# Patient Record
Sex: Male | Born: 1995 | Race: Black or African American | Hispanic: No | Marital: Single | Smoking: Never smoker
Health system: Southern US, Community
[De-identification: ages and names within clinical notes are randomized; demographics above are authoritative.]

## PROBLEM LIST (undated history)

## (undated) DIAGNOSIS — K603 Anal fistula, unspecified: Secondary | ICD-10-CM

## (undated) HISTORY — PX: WISDOM TOOTH EXTRACTION: SHX21

---
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
## 2014-01-03 ENCOUNTER — Ambulatory Visit: Payer: Medicaid Other | Admitting: Gastroenterology

## 2014-03-20 ENCOUNTER — Emergency Department (HOSPITAL_COMMUNITY)
Admission: EM | Admit: 2014-03-20 | Discharge: 2014-03-20 | Disposition: A | Discharge status: Home / Self Care | Payer: Medicaid Other | Attending: Emergency Medicine | Admitting: Emergency Medicine

## 2014-03-20 ENCOUNTER — Encounter (HOSPITAL_COMMUNITY): Payer: Self-pay | Admitting: *Deleted

## 2014-03-20 DIAGNOSIS — K625 Hemorrhage of anus and rectum: Secondary | ICD-10-CM | POA: Diagnosis present

## 2014-03-20 DIAGNOSIS — Z720 Tobacco use: Secondary | ICD-10-CM | POA: Diagnosis not present

## 2014-03-20 LAB — BASIC METABOLIC PANEL
Anion gap: 5 (ref 5–15)
BUN: 9 mg/dL (ref 6–23)
CHLORIDE: 105 meq/L (ref 96–112)
CO2: 27 mmol/L (ref 19–32)
Calcium: 9 mg/dL (ref 8.4–10.5)
Creatinine, Ser: 1.01 mg/dL (ref 0.50–1.35)
GFR calc Af Amer: >90 mL/min (ref >90)
GLUCOSE: 91 mg/dL (ref 70–99)
POTASSIUM: 3.8 mmol/L (ref 3.5–5.1)
Sodium: 137 mmol/L (ref 135–145)

## 2014-03-20 LAB — CBC
HEMATOCRIT: 43.6 % (ref 39.0–52.0)
Hemoglobin: 14.3 g/dL (ref 13.0–17.0)
MCH: 28 pg (ref 26.0–34.0)
MCHC: 32.8 g/dL (ref 30.0–36.0)
MCV: 85.3 fL (ref 78.0–100.0)
PLATELETS: 212 10*3/uL (ref 150–400)
RBC: 5.11 MIL/uL (ref 4.22–5.81)
RDW: 13.7 % (ref 11.5–15.5)
WBC: 4.7 10*3/uL (ref 4.0–10.5)

## 2014-03-20 MED ORDER — DOCUSATE SODIUM 100 MG PO CAPS: 100.0000 mg | ORAL_CAPSULE | Freq: Two times a day (BID) | ORAL | Status: DC

## 2014-03-20 NOTE — Discharge Instructions (Signed)
Please call your doctor for a followup appointment within 24-48 hours. When you talk to your doctor please let them know that you were seen in the emergency department and have them acquire all of your records so that they can discuss the findings with you and formulate a treatment plan to fully care for your new and ongoing problems. ° °[facility] Doctor List ° ° ° °Edward Hawkins MD. Specialty: Pulmonary Disease Contact information: [address]  °[address]  °[place] [address]  °[phone]  ° °Margaret Simpson, MD. Specialty: Family Medicine Contact information: [address]  °[place] [address]  °[phone]  ° °Scott Luking, MD. Specialty: Family Medicine Contact information: [address]  °[address]  °[place] [address]  °[phone]  ° °Tesfaye Fanta, MD Specialty: Internal Medicine Contact information: [address]  °[place] [address]  °[phone]  ° °Zach Hall, MD. Specialty: Internal Medicine Contact information: [address]  °[place] [address]  °[phone]  ° °Angus Mcinnis, MD. Specialty: Family Medicine Contact information: [address]  °[place] [address]  °[phone]  ° °Stephen Knowlton, MD. Specialty: Family Medicine Contact information: [address]  °[address]  °[place] [address]  °[phone]  ° °Roy Fagan, MD. Specialty: Internal Medicine Contact information: [address]  °[address]  °[place]  [address]  °[phone]  ° ° °

## 2014-03-20 NOTE — ED Notes (Signed)
Pt c/o rectal bleeding that started today; pt states he was suppose to go see GI doctor but has been yet

## 2014-03-20 NOTE — ED Provider Notes (Signed)
CSN: [id number]     Arrival date & time 03/20/14  2122 History  This chart was scribed for Vida Roller, MD by Annye Asa, ED Scribe. This patient was seen in room APA09/APA09 and the patient's care was started at 10:32 PM.    Chief Complaint  Patient presents with  . Rectal Bleeding   The history is provided by the patient. No language interpreter was used.     HPI Comments: Ryan Small is an otherwise healthy 19 y.o. male who presents to the Emergency Department complaining of 7 months of intermittent rectal bleeding. He reports that bleeding only occurs when passing a BM; he describes this as "spraying burgundy blood." He has pain with all bowel movements, regardless of consistency. He describes his stools as "brown with blood." He denies light headedness, SOB, abdominal pain.  He states that his BM's are normal - sometimes with blood spots in them.  The bleeding occurs every couple of months - it is not daily.  He was scheduled to see a GI but continued to reschedule the appointment until he was told by the office staff that he would have to seek another referral.  He is not on blood thinners.   Past Medical History  Diagnosis Date  . Allergy     peanuts, all nuts   Past Surgical History  Procedure Laterality Date  . Wisdom tooth extraction     Family History  Problem Relation Age of Onset  . Diabetes Mother    History  Substance Use Topics  . Smoking status: Current Some Day Smoker  . Smokeless tobacco: Never Used  . Alcohol Use: Yes     Comment: ocassionally    Review of Systems  Gastrointestinal: Positive for blood in stool and anal bleeding.  All other systems reviewed and are negative.   Allergies  Peanut-containing drug products  Home Medications   Prior to Admission medications   Medication Sig Start Date End Date Taking? Authorizing Provider  docusate sodium (COLACE) 100 MG capsule Take 1 capsule (100 mg total) by mouth every 12 (twelve) hours.  03/20/14   Vida Roller, MD  EPINEPHrine (EPIPEN) 0.3 mg/0.3 mL DEVI Inject 0.3 mLs (0.3 mg total) into the muscle as needed. 07/28/12   Hope Orlene Och, NP  oxyCODONE-acetaminophen (PERCOCET) 5-325 MG per tablet Take 1 tablet by mouth every 4 (four) hours as needed. Patient not taking: Reported on 03/20/2014 04/26/13   Donnetta Hutching, MD  promethazine (PHENERGAN) 25 MG tablet Take 1 tablet (25 mg total) by mouth every 6 (six) hours as needed. Patient not taking: Reported on 03/20/2014 04/26/13   Donnetta Hutching, MD   BP 143/99 mmHg  Pulse 77  Temp(Src) 98.3 F (36.8 C) (Oral)  Resp 24  Ht  (1.803 m)  Wt 171 lb (77.565 kg)  BMI 23.86 kg/m2  SpO2 100% Physical Exam  Constitutional: He appears well-developed and well-nourished.  HENT:  Head: Normocephalic and atraumatic.  Eyes: Conjunctivae are normal. Right eye exhibits no discharge. Left eye exhibits no discharge.  Pulmonary/Chest: Effort normal. No respiratory distress.  Abdominal: Soft. There is no tenderness.  Genitourinary:  Patient refused rectal exam after thorough explanation of the medical necessity of the procedure. External exam tolerated - no signs of fissure, or external hemorrhoid or perianal masses / tendereness or redness  Neurological: He is alert. Coordination normal.  Skin: Skin is warm and dry. No rash noted. He is not diaphoretic. No erythema.  Psychiatric: He has a  normal mood and affect.  Nursing note and vitals reviewed.   ED Course  Procedures   DIAGNOSTIC STUDIES: Oxygen Saturation is 100% on RA, normal by my interpretation.    COORDINATION OF CARE: 10:40 PM Discussed treatment plan with pt at bedside and pt agreed to plan.   Labs Review Labs Reviewed  CBC  BASIC METABOLIC PANEL    Imaging Review No results found.    MDM   Final diagnoses:  Rectal bleeding    Overall the patient is well-appearing with normal vital signs, he is not tachypneic on my exam. He has a normal hemoglobin, he refuses  rectal exam after multiple discussions regarding the necessity of this procedure. I will give him another referral to the gastroenterologist, he appears hemodynamically stable. I have counseled him regarding not placing anything in his rectum, against using anticoagulants and I have cautioned him that he should use stool softeners.  Meds given in ED:  Medications - No data to display  New Prescriptions   DOCUSATE SODIUM (COLACE) 100 MG CAPSULE    Take 1 capsule (100 mg total) by mouth every 12 (twelve) hours.     I personally performed the services described in this documentation, which was scribed in my presence. The recorded information has been reviewed and is accurate.        Vida Roller, MD 03/20/14 [phone]

## 2014-04-12 ENCOUNTER — Telehealth: Payer: Self-pay | Admitting: Internal Medicine

## 2014-04-12 ENCOUNTER — Encounter: Payer: Self-pay | Admitting: General Practice

## 2014-04-12 ENCOUNTER — Ambulatory Visit: Payer: Medicaid Other | Admitting: Nurse Practitioner

## 2014-04-12 NOTE — Telephone Encounter (Signed)
We need to retract the discharge letter since the patient has an upcoming appt that

## 2014-04-12 NOTE — Telephone Encounter (Signed)
Letter mailed

## 2014-04-12 NOTE — Telephone Encounter (Signed)
We need to send out a discharge letter.

## 2014-04-12 NOTE — Telephone Encounter (Signed)
Send it

## 2014-04-12 NOTE — Telephone Encounter (Signed)
We received a [facility]. He has either no showed or cancelled his OV from 11/01/13, 12/02/13, 01/03/14 and 04/12/14 and is currently scheduled for 05/01/2014.  [facility] [place] [facility] and told her that we have been trying to see this patient for rectal bleeding since August 2015 and has not kept any of his appointments and we can not continue making office visits for him if he isn't going to come to them and it's been 6 months. She agreed.

## 2014-04-12 NOTE — Telephone Encounter (Signed)
If he no shows for that appointment, we will send a discharge letter at that time.  Discharge letter was voided out of Epic.

## 2014-04-12 NOTE — Telephone Encounter (Signed)
So, do we have a doctor-patient relationship based on multiple scheduled and canceled appointments and one upcoming or not. Do we need to cancel and send a discharge letter?

## 2014-05-01 ENCOUNTER — Ambulatory Visit: Payer: Medicaid Other | Admitting: Nurse Practitioner

## 2014-05-02 ENCOUNTER — Encounter: Payer: Self-pay | Admitting: Nurse Practitioner

## 2014-05-17 ENCOUNTER — Ambulatory Visit: Payer: Medicaid Other | Admitting: Nurse Practitioner

## 2014-05-22 ENCOUNTER — Telehealth: Payer: Self-pay | Admitting: Nurse Practitioner

## 2014-05-22 ENCOUNTER — Ambulatory Visit: Payer: Medicaid Other | Admitting: Nurse Practitioner

## 2014-05-22 NOTE — Telephone Encounter (Signed)
PATIENT WAS A NO SHOW

## 2014-05-22 NOTE — Telephone Encounter (Signed)
Noted  

## 2014-05-24 ENCOUNTER — Emergency Department (HOSPITAL_COMMUNITY): Payer: Medicaid Other

## 2014-05-24 ENCOUNTER — Emergency Department (HOSPITAL_COMMUNITY)
Admission: EM | Admit: 2014-05-24 | Discharge: 2014-05-24 | Disposition: A | Discharge status: Home / Self Care | Payer: Medicaid Other | Attending: Emergency Medicine | Admitting: Emergency Medicine

## 2014-05-24 ENCOUNTER — Encounter (HOSPITAL_COMMUNITY): Payer: Self-pay

## 2014-05-24 DIAGNOSIS — Z79899 Other long term (current) drug therapy: Secondary | ICD-10-CM | POA: Diagnosis not present

## 2014-05-24 DIAGNOSIS — J069 Acute upper respiratory infection, unspecified: Secondary | ICD-10-CM

## 2014-05-24 DIAGNOSIS — M791 Myalgia: Secondary | ICD-10-CM | POA: Diagnosis not present

## 2014-05-24 DIAGNOSIS — R069 Unspecified abnormalities of breathing: Secondary | ICD-10-CM | POA: Insufficient documentation

## 2014-05-24 DIAGNOSIS — R05 Cough: Secondary | ICD-10-CM | POA: Diagnosis present

## 2014-05-24 MED ORDER — ACETAMINOPHEN 325 MG PO TABS
650.0000 mg | ORAL_TABLET | Freq: Once | ORAL | Status: AC
  Administered 2014-05-24: 650 mg via ORAL
  Filled 2014-05-24: qty 2

## 2014-05-24 MED ORDER — KETOROLAC TROMETHAMINE 10 MG PO TABS
10.0000 mg | ORAL_TABLET | Freq: Once | ORAL | Status: AC
  Administered 2014-05-24: 10 mg via ORAL
  Filled 2014-05-24: qty 1

## 2014-05-24 NOTE — ED Provider Notes (Signed)
CSN: [id number]     Arrival date & time 05/24/14  [id number] History   First MD Initiated Contact with Patient 05/24/14 [phone]     Chief Complaint  Patient presents with  . Cough  . Generalized Body Aches     (Consider location/radiation/quality/duration/timing/severity/associated sxs/prior Treatment) Patient is a 19 y.o. male presenting with cough. The history is provided by the patient.  Cough Cough characteristics:  Non-productive Severity:  Moderate Onset quality:  Gradual Duration:  1 day Timing:  Intermittent Progression:  Worsening Chronicity:  New Smoker: yes   Context: sick contacts and weather changes   Relieved by:  Nothing Worsened by:  Nothing tried Associated symptoms: chills, myalgias, sinus congestion and sore throat   Associated symptoms: no chest pain, no eye discharge, no rash, no shortness of breath and no wheezing   Associated symptoms comment:  Watery eyes Risk factors: no recent travel     Past Medical History  Diagnosis Date  . Allergy     peanuts, all nuts   Past Surgical History  Procedure Laterality Date  . Wisdom tooth extraction     Family History  Problem Relation Age of Onset  . Diabetes Mother    History  Substance Use Topics  . Smoking status: Current Some Day Smoker  . Smokeless tobacco: Never Used  . Alcohol Use: Yes     Comment: ocassionally    Review of Systems  Constitutional: Positive for chills and fatigue. Negative for activity change.       All ROS Neg except as noted in HPI  HENT: Positive for congestion and sore throat. Negative for nosebleeds.   Eyes: Negative for photophobia and discharge.  Respiratory: Positive for cough. Negative for shortness of breath and wheezing.   Cardiovascular: Negative for chest pain and palpitations.  Gastrointestinal: Negative for abdominal pain and blood in stool.  Genitourinary: Negative for dysuria, frequency and hematuria.  Musculoskeletal: Positive for myalgias. Negative for back pain,  arthralgias and neck pain.  Skin: Negative.  Negative for rash.  Neurological: Negative for dizziness, seizures and speech difficulty.  Psychiatric/Behavioral: Negative for hallucinations and confusion.      Allergies  Peanut-containing drug products  Home Medications   Prior to Admission medications   Medication Sig Start Date End Date Taking? Authorizing Provider  docusate sodium (COLACE) 100 MG capsule Take 1 capsule (100 mg total) by mouth every 12 (twelve) hours. 03/20/14   Eber HongBrian Miller, MD  EPINEPHrine (EPIPEN) 0.3 mg/0.3 mL DEVI Inject 0.3 mLs (0.3 mg total) into the muscle as needed. 07/28/12   Hope Orlene OchM Neese, NP  oxyCODONE-acetaminophen (PERCOCET) 5-325 MG per tablet Take 1 tablet by mouth every 4 (four) hours as needed. Patient not taking: Reported on 03/20/2014 04/26/13   Donnetta HutchingBrian Cook, MD  promethazine (PHENERGAN) 25 MG tablet Take 1 tablet (25 mg total) by mouth every 6 (six) hours as needed. Patient not taking: Reported on 03/20/2014 04/26/13   Donnetta HutchingBrian Cook, MD   BP 134/94 mmHg  Pulse 71  Temp(Src) 98.2 F (36.8 C) (Oral)  Resp 16  SpO2 100% Physical Exam  Constitutional: He is oriented to person, place, and time. He appears well-developed and well-nourished.  Non-toxic appearance.  HENT:  Head: Normocephalic.  Right Ear: Tympanic membrane and external ear normal.  Left Ear: Tympanic membrane and external ear normal.  Mild to mod nasal congestion.  Eyes: EOM and lids are normal. Pupils are equal, round, and reactive to light.  Neck: Normal range of motion. Neck supple. Carotid bruit  is not present.  Cardiovascular: Normal rate, regular rhythm, normal heart sounds, intact distal pulses and normal pulses.   Pulmonary/Chest: Breath sounds normal. No respiratory distress.  Course breath sounds. Symmetrical rise and fall of the chest.  Abdominal: Soft. Bowel sounds are normal. There is no tenderness. There is no guarding.  Musculoskeletal: Normal range of motion.  Muscle soreness  of upper and lower extremities. No hot joints.  Lymphadenopathy:       Head (right side): No submandibular adenopathy present.       Head (left side): No submandibular adenopathy present.    He has no cervical adenopathy.  Neurological: He is alert and oriented to person, place, and time. He has normal strength. No cranial nerve deficit or sensory deficit.  Skin: Skin is warm and dry.  Psychiatric: He has a normal mood and affect. His speech is normal.  Nursing note and vitals reviewed.   ED Course  Procedures (including critical care time) Labs Review Labs Reviewed - No data to display  Imaging Review No results found.   EKG Interpretation None      MDM  Vital signs are non-acute. Chest xray is negative for acute problem. Suspect viral illness. Pt will increase fluids, wash hands frequently, and use a mask until symptoms resolve. Ibuprofen q6h.   Final diagnoses:  None    **I have reviewed nursing notes, vital signs, and all appropriate lab and imaging results for this patient.Ivery Quale, PA-C 05/24/14 1033  Bethann Berkshire, MD 05/24/14 [phone]

## 2014-05-24 NOTE — Discharge Instructions (Signed)
Please increase water, juice, and gatorades. Use a mask until symptoms have resolved. Use ibuprofen every 6 hours for fever, and aching. Wash hands frequently.  Upper Respiratory Infection, Adult An upper respiratory infection (URI) is also sometimes known as the common cold. The upper respiratory tract includes the nose, sinuses, throat, trachea, and bronchi. Bronchi are the airways leading to the lungs. Most people improve within 1 week, but symptoms can last up to 2 weeks. A residual cough may last even longer.  CAUSES Many different viruses can infect the tissues lining the upper respiratory tract. The tissues become irritated and inflamed and often become very moist. Mucus production is also common. A cold is contagious. You can easily spread the virus to others by oral contact. This includes kissing, sharing a glass, coughing, or sneezing. Touching your mouth or nose and then touching a surface, which is then touched by another person, can also spread the virus. SYMPTOMS  Symptoms typically develop 1 to 3 days after you come in contact with a cold virus. Symptoms vary from person to person. They may include:  Runny nose.  Sneezing.  Nasal congestion.  Sinus irritation.  Sore throat.  Loss of voice (laryngitis).  Cough.  Fatigue.  Muscle aches.  Loss of appetite.  Headache.  Low-grade fever. DIAGNOSIS  You might diagnose your own cold based on familiar symptoms, since most people get a cold 2 to 3 times a year. Your caregiver can confirm this based on your exam. Most importantly, your caregiver can check that your symptoms are not due to another disease such as strep throat, sinusitis, pneumonia, asthma, or epiglottitis. Blood tests, throat tests, and X-rays are not necessary to diagnose a common cold, but they may sometimes be helpful in excluding other more serious diseases. Your caregiver will decide if any further tests are required. RISKS AND COMPLICATIONS  You may be at  risk for a more severe case of the common cold if you smoke cigarettes, have chronic heart disease (such as heart failure) or lung disease (such as asthma), or if you have a weakened immune system. The very young and very old are also at risk for more serious infections. Bacterial sinusitis, middle ear infections, and bacterial pneumonia can complicate the common cold. The common cold can worsen asthma and chronic obstructive pulmonary disease (COPD). Sometimes, these complications can require emergency medical care and may be life-threatening. PREVENTION  The best way to protect against getting a cold is to practice good hygiene. Avoid oral or hand contact with people with cold symptoms. Wash your hands often if contact occurs. There is no clear evidence that vitamin C, vitamin E, echinacea, or exercise reduces the chance of developing a cold. However, it is always recommended to get plenty of rest and practice good nutrition. TREATMENT  Treatment is directed at relieving symptoms. There is no cure. Antibiotics are not effective, because the infection is caused by a virus, not by bacteria. Treatment may include:  Increased fluid intake. Sports drinks offer valuable electrolytes, sugars, and fluids.  Breathing heated mist or steam (vaporizer or shower).  Eating chicken soup or other clear broths, and maintaining good nutrition.  Getting plenty of rest.  Using gargles or lozenges for comfort.  Controlling fevers with ibuprofen or acetaminophen as directed by your caregiver.  Increasing usage of your inhaler if you have asthma. Zinc gel and zinc lozenges, taken in the first 24 hours of the common cold, can shorten the duration and lessen the severity of symptoms.  Pain medicines may help with fever, muscle aches, and throat pain. A variety of non-prescription medicines are available to treat congestion and runny nose. Your caregiver can make recommendations and may suggest nasal or lung inhalers for  other symptoms.  HOME CARE INSTRUCTIONS   Only take over-the-counter or prescription medicines for pain, discomfort, or fever as directed by your caregiver.  Use a warm mist humidifier or inhale steam from a shower to increase air moisture. This may keep secretions moist and make it easier to breathe.  Drink enough water and fluids to keep your urine clear or pale yellow.  Rest as needed.  Return to work when your temperature has returned to normal or as your caregiver advises. You may need to stay home longer to avoid infecting others. You can also use a face mask and careful hand washing to prevent spread of the virus. SEEK MEDICAL CARE IF:   After the first few days, you feel you are getting worse rather than better.  You need your caregiver's advice about medicines to control symptoms.  You develop chills, worsening shortness of breath, or brown or red sputum. These may be signs of pneumonia.  You develop yellow or brown nasal discharge or pain in the face, especially when you bend forward. These may be signs of sinusitis.  You develop a fever, swollen neck glands, pain with swallowing, or white areas in the back of your throat. These may be signs of strep throat. SEEK IMMEDIATE MEDICAL CARE IF:   You have a fever.  You develop severe or persistent headache, ear pain, sinus pain, or chest pain.  You develop wheezing, a prolonged cough, cough up blood, or have a change in your usual mucus (if you have chronic lung disease).  You develop sore muscles or a stiff neck. Document Released: 08/27/2000 Document Revised: 05/26/2011 Document Reviewed: 06/08/2013 [facility] Patient Information 2015 [employer] [place]. This information is not intended to replace advice given to you by your health care provider. Make sure you discuss any questions you have with your health care provider.

## 2014-05-24 NOTE — ED Notes (Signed)
Generalized body aches since yesterday with sore throat and cough and watery eyes per pt.

## 2014-06-21 ENCOUNTER — Emergency Department (HOSPITAL_COMMUNITY): Payer: Medicaid Other

## 2014-06-21 ENCOUNTER — Emergency Department (HOSPITAL_COMMUNITY)
Admission: EM | Admit: 2014-06-21 | Discharge: 2014-06-21 | Disposition: A | Discharge status: Home / Self Care | Payer: Medicaid Other | Attending: Emergency Medicine | Admitting: Emergency Medicine

## 2014-06-21 ENCOUNTER — Encounter (HOSPITAL_COMMUNITY): Payer: Self-pay | Admitting: Emergency Medicine

## 2014-06-21 DIAGNOSIS — R410 Disorientation, unspecified: Secondary | ICD-10-CM | POA: Diagnosis not present

## 2014-06-21 DIAGNOSIS — R000 Tachycardia, unspecified: Secondary | ICD-10-CM | POA: Insufficient documentation

## 2014-06-21 DIAGNOSIS — Z720 Tobacco use: Secondary | ICD-10-CM | POA: Diagnosis not present

## 2014-06-21 DIAGNOSIS — R4182 Altered mental status, unspecified: Secondary | ICD-10-CM | POA: Diagnosis present

## 2014-06-21 DIAGNOSIS — F05 Delirium due to known physiological condition: Secondary | ICD-10-CM

## 2014-06-21 LAB — CBC WITH DIFFERENTIAL/PLATELET
Basophils Absolute: 0 10*3/uL (ref 0.0–0.1)
Basophils Relative: 0 % (ref 0–1)
Eosinophils Absolute: 0.2 10*3/uL (ref 0.0–0.7)
Eosinophils Relative: 4 % (ref 0–5)
HCT: 41 % (ref 39.0–52.0)
HEMOGLOBIN: 13.7 g/dL (ref 13.0–17.0)
LYMPHS ABS: 1.7 10*3/uL (ref 0.7–4.0)
Lymphocytes Relative: 37 % (ref 12–46)
MCH: 27.7 pg (ref 26.0–34.0)
MCHC: 33.4 g/dL (ref 30.0–36.0)
MCV: 82.8 fL (ref 78.0–100.0)
MONOS PCT: 12 % (ref 3–12)
Monocytes Absolute: 0.5 10*3/uL (ref 0.1–1.0)
Neutro Abs: 2.1 10*3/uL (ref 1.7–7.7)
Neutrophils Relative %: 47 % (ref 43–77)
Platelets: 206 10*3/uL (ref 150–400)
RBC: 4.95 MIL/uL (ref 4.22–5.81)
RDW: 13.9 % (ref 11.5–15.5)
WBC: 4.5 10*3/uL (ref 4.0–10.5)

## 2014-06-21 LAB — COMPREHENSIVE METABOLIC PANEL
ALK PHOS: 85 U/L (ref 39–117)
ALT: 15 U/L (ref 0–53)
ANION GAP: 8 (ref 5–15)
AST: 22 U/L (ref 0–37)
Albumin: 3.6 g/dL (ref 3.5–5.2)
BILIRUBIN TOTAL: 0.7 mg/dL (ref 0.3–1.2)
BUN: 10 mg/dL (ref 6–23)
CO2: 26 mmol/L (ref 19–32)
Calcium: 8.7 mg/dL (ref 8.4–10.5)
Chloride: 105 mmol/L (ref 96–112)
Creatinine, Ser: 1.12 mg/dL (ref 0.50–1.35)
GFR calc Af Amer: >90 mL/min (ref >90)
GLUCOSE: 95 mg/dL (ref 70–99)
POTASSIUM: 3.8 mmol/L (ref 3.5–5.1)
Sodium: 139 mmol/L (ref 135–145)
Total Protein: 6.4 g/dL (ref 6.0–8.3)

## 2014-06-21 LAB — URINALYSIS, ROUTINE W REFLEX MICROSCOPIC
BILIRUBIN URINE: NEGATIVE
Glucose, UA: NEGATIVE mg/dL
Hgb urine dipstick: NEGATIVE
Ketones, ur: NEGATIVE mg/dL
Leukocytes, UA: NEGATIVE
Nitrite: NEGATIVE
PROTEIN: NEGATIVE mg/dL
SPECIFIC GRAVITY, URINE: 1.014 (ref 1.005–1.030)
Urobilinogen, UA: 1 mg/dL (ref 0.0–1.0)
pH: 6 (ref 5.0–8.0)

## 2014-06-21 LAB — ACETAMINOPHEN LEVEL: Acetaminophen (Tylenol), Serum: <10 ug/mL — ABNORMAL LOW (ref 10–30)

## 2014-06-21 LAB — RAPID URINE DRUG SCREEN, HOSP PERFORMED
AMPHETAMINES: NOT DETECTED
BENZODIAZEPINES: NOT DETECTED
Barbiturates: NOT DETECTED
Cocaine: NOT DETECTED
Opiates: NOT DETECTED
TETRAHYDROCANNABINOL: POSITIVE — AB

## 2014-06-21 LAB — CBG MONITORING, ED: Glucose-Capillary: 85 mg/dL (ref 70–99)

## 2014-06-21 LAB — SALICYLATE LEVEL: Salicylate Lvl: <4 mg/dL (ref 2.8–20.0)

## 2014-06-21 LAB — ETHANOL: Alcohol, Ethyl (B): <5 mg/dL (ref 0–9)

## 2014-06-21 MED ORDER — SODIUM CHLORIDE 0.9 % IV BOLUS (SEPSIS)
1000.0000 mL | Freq: Once | INTRAVENOUS | Status: AC
  Administered 2014-06-21: 1000 mL via INTRAVENOUS

## 2014-06-21 NOTE — ED Notes (Signed)
Pt ambulated to the bathroom with standby assistance from myself. Pt tolerated well.

## 2014-06-21 NOTE — ED Notes (Signed)
MD Rees at bedside. 

## 2014-06-21 NOTE — ED Notes (Signed)
Gave pt water per MD Madilyn Hookees. Pt tolerating well at this time.

## 2014-06-21 NOTE — ED Provider Notes (Signed)
CSN: [id number]     Arrival date & time 06/21/14  1905 History   First MD Initiated Contact with Patient 06/21/14 1906     Chief Complaint  Patient presents with  . Altered Mental Status     Patient is a 19 y.o. male presenting with altered mental status. The history is provided by the patient and the EMS personnel. No language interpreter was used.  Altered Mental Status  Mr. Kovar presents for evaluation of change in his mental status. 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 He states "I know I'm here, but I feel like I'm in a giant movie theater." He denies any previous medical problems and he denies tobacco, alcohol use. He endorses occasional marijuana use but none recently. He denies similar symptoms previously. He denies any active hallucinations, SI, HI. He states he is a family medical history of diabetes. Symptoms are moderate, constant, improving. He denies any over-the-counter drug use.  Past Medical History  Diagnosis Date  . Allergy     peanuts, all nuts   Past Surgical History  Procedure Laterality Date  . Wisdom tooth extraction     Family History  Problem Relation Age of Onset  . Diabetes Mother    History  Substance Use Topics  . Smoking status: Current Some Day Smoker  . Smokeless tobacco: Never Used  . Alcohol Use: Yes     Comment: ocassionally    Review of Systems  All other systems reviewed and are negative.     Allergies  Peanut-containing drug products  Home Medications   Prior to Admission medications   Medication Sig Start Date End Date Taking? Authorizing Provider  docusate sodium (COLACE) 100 MG capsule Take 1 capsule (100 mg total) by mouth every 12 (twelve) hours. Patient not taking: Reported on 05/24/2014 03/20/14   Eber Hong, MD  EPINEPHrine (EPIPEN) 0.3 mg/0.3 mL DEVI Inject 0.3 mLs (0.3 mg total) into the muscle as needed. 07/28/12   Hope Orlene Och, NP  oxyCODONE-acetaminophen (PERCOCET) 5-325 MG per tablet Take 1 tablet by mouth every 4 (four) hours as needed. Patient not taking: Reported on 03/20/2014 04/26/13   Donnetta Hutching, MD  promethazine (PHENERGAN) 25 MG tablet Take 1 tablet (25 mg total) by mouth every 6 (six) hours as needed. Patient not taking: Reported on 03/20/2014 04/26/13   Donnetta Hutching, MD   BP 157/120 mmHg  Pulse 114  Resp 29  Ht  (1.803 m)  Wt 175 lb (79.379 kg)  BMI 24.42 kg/m2  SpO2 100% Physical Exam  Constitutional: He is oriented to person, place, and time. He appears well-developed and well-nourished.  HENT:  Head: Normocephalic and atraumatic.  Eyes: Pupils are equal, round, and reactive to light.  Cardiovascular: Regular rhythm.   No murmur heard. tachycardic  Pulmonary/Chest: Effort normal and breath sounds normal. No respiratory distress.  Abdominal: Soft. There is no tenderness. There is no rebound and no guarding.  Musculoskeletal: He exhibits no edema or tenderness.  Neurological: He is alert and oriented to person, place, and time. No cranial nerve deficit.  Skin: Skin is warm and dry.  Psychiatric:  Anxious  Nursing note and vitals reviewed.   ED Course  Procedures (including critical care time) Labs Review Labs Reviewed  URINE RAPID DRUG SCREEN (HOSP PERFORMED) - Abnormal; Notable for the following:    Tetrahydrocannabinol POSITIVE (*)  All other components within normal limits  ACETAMINOPHEN LEVEL - Abnormal; Notable for the following:    Acetaminophen (Tylenol), Serum <10.0 (*)    All other components within normal limits  COMPREHENSIVE METABOLIC PANEL  CBC WITH DIFFERENTIAL/PLATELET  URINALYSIS, ROUTINE W REFLEX MICROSCOPIC  SALICYLATE LEVEL  ETHANOL  CBG MONITORING, ED    Imaging Review No results found.   EKG Interpretation   Date/Time:  Wednesday June 21 2014 19:12:45 EDT Ventricular Rate:  103 PR Interval:  136 QRS Duration: 84 QT Interval:  320 QTC  Calculation: 419 R Axis:   85 Text Interpretation:  Sinus tachycardia ST elev, probable normal early  repol pattern Confirmed by Lincoln Brighamees, Liz [phone]([id number]) on 06/21/2014 7:59:16 PM      MDM   Final diagnoses:  Acute confusional state    Patient here for evaluation of confusion prior to ED arrival. His symptoms have completely resolved on recheck. History of presentation is not consistent with meningitis, subarachnoid hemorrhage, CVA. On additional questioning family states that he's been drinking multiple energy drinks a day for the last 2 days. Question if he has some side effects related to energy drinks as well as marijuana use. Discussed with patient PCP follow-up, rest, return precautions.    Tilden FossaElizabeth Jeorgia Helming, MD 06/22/14 0003

## 2014-06-21 NOTE — ED Notes (Signed)
Phlebotomy at bedside.

## 2014-06-21 NOTE — ED Notes (Signed)
Per EMS, pt was at work when at around [employer] "felt out of it." Upon EMS arrival, pt seemed very anxious and was pacing. Pt AOx4 stating "I know I'm here, but I feel like this is a giant movie theatre." Pt denies ETOH or drug use. Pt denies any auditory or visual hallucinations. NAD noted. Pt hypertensive 136/102. CBG 85. HR 122.

## 2014-06-21 NOTE — Discharge Instructions (Signed)
Confusion Confusion is the inability to think with your usual speed or clarity. Confusion may come on quickly or slowly over time. How quickly the confusion comes on depends on the cause. Confusion can be due to any number of causes. CAUSES   Concussion, head injury, or head trauma.  Seizures.  Stroke.  Fever.  Brain tumor.  Age related decreased brain function (dementia).  Heightened emotional states like rage or terror.  Mental illness in which the person loses the ability to determine what is real and what is not (hallucinations).  Infections such as a urinary tract infection (UTI).  Toxic effects from alcohol, drugs, or prescription medicines.  Dehydration and an imbalance of salts in the body (electrolytes).  Lack of sleep.  Low blood sugar (diabetes).  Low levels of oxygen from conditions such as chronic lung disorders.  Drug interactions or other medicine side effects.  Nutritional deficiencies, especially niacin, thiamine, vitamin C, or vitamin B.  Sudden drop in body temperature (hypothermia).  Change in routine, such as when traveling or hospitalized. SIGNS AND SYMPTOMS  People often describe their thinking as cloudy or unclear when they are confused. Confusion can also include feeling disoriented. That means you are unaware of where or who you are. You may also not know what the date or time is. If confused, you may also have difficulty paying attention, remembering, and making decisions. Some people also act aggressively when they are confused.  DIAGNOSIS  The medical evaluation of confusion may include:  Blood and urine tests.  X-rays.  Brain and nervous system tests.  Analyzing your brain waves (electroencephalogram or EEG).  Magnetic resonance imaging (MRI) of your head.  Computed tomography (CT) scan of your head.  Mental status tests in which your health care provider may ask many questions. Some of these questions may seem silly or strange,  but they are a very important test to help diagnose and treat confusion. TREATMENT  An admission to the hospital may not be needed, but a person with confusion should not be left alone. Stay with a family member or friend until the confusion clears. Avoid alcohol, pain relievers, or sedative drugs until you have fully recovered. Do not drive until directed by your health care provider. HOME CARE INSTRUCTIONS  What family and friends can do:  To find out if someone is confused, ask the person to state his or her name, age, and the date. If the person is unsure or answers incorrectly, he or she is confused.  Always introduce yourself, no matter how well the person knows you.  Often remind the person of his or her location.  Place a calendar and clock near the confused person.  Help the person with his or her medicines. You may want to use a pill box, an alarm as a reminder, or give the person each dose as prescribed.  Talk about current events and plans for the day.  Try to keep the environment calm, quiet, and peaceful.  Make sure the person keeps follow-up visits with his or her health care provider. PREVENTION  Ways to prevent confusion:  Avoid alcohol.  Eat a balanced diet.  Get enough sleep.  Take medicine only as directed by your health care provider.  Do not become isolated. Spend time with other people and make plans for your days.  Keep careful watch on your blood sugar levels if you are diabetic. SEEK IMMEDIATE MEDICAL CARE IF:   You develop severe headaches, repeated vomiting, seizures, blackouts, or   slurred speech.  There is increasing confusion, weakness, numbness, restlessness, or personality changes.  You develop a loss of balance, have marked dizziness, feel uncoordinated, or fall.  You have delusions, hallucinations, or develop severe anxiety.  Your family members think you need to be rechecked. Document Released: 04/10/2004 Document Revised: 07/18/2013  Document Reviewed: 04/08/2013 ExitCare Patient Information 2015 [employer]. This information is not intended to replace advice given to you by your health care provider. Make sure you discuss any questions you have with your health care provider.  

## 2014-07-08 ENCOUNTER — Emergency Department (HOSPITAL_COMMUNITY)
Admission: EM | Admit: 2014-07-08 | Discharge: 2014-07-08 | Disposition: A | Discharge status: Home / Self Care | Payer: Medicaid Other | Attending: Emergency Medicine | Admitting: Emergency Medicine

## 2014-07-08 ENCOUNTER — Encounter (HOSPITAL_COMMUNITY): Payer: Self-pay | Admitting: Emergency Medicine

## 2014-07-08 DIAGNOSIS — Z720 Tobacco use: Secondary | ICD-10-CM | POA: Insufficient documentation

## 2014-07-08 DIAGNOSIS — K625 Hemorrhage of anus and rectum: Secondary | ICD-10-CM

## 2014-07-08 LAB — COMPREHENSIVE METABOLIC PANEL
ALBUMIN: 4.2 g/dL (ref 3.5–5.2)
ALK PHOS: 83 U/L (ref 39–117)
ALT: 14 U/L (ref 0–53)
ANION GAP: 9 (ref 5–15)
AST: 26 U/L (ref 0–37)
BUN: 17 mg/dL (ref 6–23)
CO2: 27 mmol/L (ref 19–32)
Calcium: 9.5 mg/dL (ref 8.4–10.5)
Chloride: 103 mmol/L (ref 96–112)
Creatinine, Ser: 1.11 mg/dL (ref 0.50–1.35)
GFR calc Af Amer: >90 mL/min (ref >90)
GFR calc non Af Amer: >90 mL/min (ref >90)
Glucose, Bld: 101 mg/dL — ABNORMAL HIGH (ref 70–99)
POTASSIUM: 4.3 mmol/L (ref 3.5–5.1)
SODIUM: 139 mmol/L (ref 135–145)
Total Bilirubin: 0.8 mg/dL (ref 0.3–1.2)
Total Protein: 7.2 g/dL (ref 6.0–8.3)

## 2014-07-08 LAB — CBC WITH DIFFERENTIAL/PLATELET
Basophils Absolute: 0 10*3/uL (ref 0.0–0.1)
Basophils Relative: 0 % (ref 0–1)
EOS ABS: 0.2 10*3/uL (ref 0.0–0.7)
EOS PCT: 4 % (ref 0–5)
HCT: 44.5 % (ref 39.0–52.0)
HEMOGLOBIN: 14.8 g/dL (ref 13.0–17.0)
Lymphocytes Relative: 40 % (ref 12–46)
Lymphs Abs: 1.9 10*3/uL (ref 0.7–4.0)
MCH: 27.5 pg (ref 26.0–34.0)
MCHC: 33.3 g/dL (ref 30.0–36.0)
MCV: 82.7 fL (ref 78.0–100.0)
MONOS PCT: 8 % (ref 3–12)
Monocytes Absolute: 0.4 10*3/uL (ref 0.1–1.0)
Neutro Abs: 2.3 10*3/uL (ref 1.7–7.7)
Neutrophils Relative %: 48 % (ref 43–77)
PLATELETS: 224 10*3/uL (ref 150–400)
RBC: 5.38 MIL/uL (ref 4.22–5.81)
RDW: 13.8 % (ref 11.5–15.5)
WBC: 4.7 10*3/uL (ref 4.0–10.5)

## 2014-07-08 LAB — LIPASE, BLOOD: Lipase: 22 U/L (ref 11–59)

## 2014-07-08 NOTE — ED Notes (Signed)
Pt states he is been having dark stool and blood on his rectum, denies any fever, nausea or vomiting.

## 2014-07-08 NOTE — ED Notes (Signed)
Patient alert and oriented at discharge.  Patient ambulatory to the waiting room with RN.

## 2014-07-08 NOTE — Discharge Instructions (Signed)
Bloody Stools Mr. Ryan Small, call the gastroenterology doctor for an appointment within 3 days to evaluate your bleeding. Your blood counts today are normal. If you continue to bleed and have symptoms of lightheadedness or shortness of breath come back to emergency department immediately. Thank you. Bloody stools means there is blood in your poop (stool). It is a sign that there is a problem somewhere in the digestive system. It is important for your doctor to find the cause of your bleeding, so the problem can be treated.  HOME CARE  Only take medicine as told by your doctor.  Eat foods with fiber (prunes, bran cereals).  Drink enough fluids to keep your pee (urine) clear or pale yellow.  Sit in warm water (sitz bath) for 10 to 15 minutes as told by your doctor.  Know how to take your medicines (enemas, suppositories) if advised by your doctor.  Watch for signs that you are getting better or getting worse. GET HELP RIGHT AWAY IF:   You are not getting better.  You start to get better but then get worse again.  You have new problems.  You have severe bleeding from the place where poop comes out (rectum) that does not stop.  You throw up (vomit) blood.  You feel weak or pass out (faint).  You have a fever. MAKE SURE YOU:   Understand these instructions.  Will watch your condition.  Will get help right away if you are not doing well or get worse. Document Released: 02/19/2009 Document Revised: 05/26/2011 Document Reviewed: 07/19/2010 [facility] Patient Information 2015 [place], [place]. This information is not intended to replace advice given to you by your health care provider. Make sure you discuss any questions you have with your health care provider.

## 2014-07-08 NOTE — ED Provider Notes (Signed)
CSN: [id number]     Arrival date & time 07/08/14  0443 History   None    Chief Complaint  Patient presents with  . Rectal Bleeding     (Consider location/radiation/quality/duration/timing/severity/associated sxs/prior Treatment) HPI [name] is a 19 y.o. male with no second past medical history presenting today with rectal bleeding. Patient states this has been going on for approximately one year. The most recent episode began today during one bowel movement. He saw bright red blood and presents for evaluation. He denies any shortness of breath or lightheadedness. Patient has had referral to gastroenterology in the past but did not make his appointment several times. He is here to obtain another referral. He denies any abdominal pain, fevers, recent infections, or bleeding from elsewhere. Patient has no further complaints.  10 Systems reviewed and are negative for acute change except as noted in the HPI.    Past Medical History  Diagnosis Date  . Allergy     peanuts, all nuts   Past Surgical History  Procedure Laterality Date  . Wisdom tooth extraction     Family History  Problem Relation Age of Onset  . Diabetes Mother    History  Substance Use Topics  . Smoking status: Current Some Day Smoker -- 0.50 packs/day    Types: Cigars  . Smokeless tobacco: Never Used  . Alcohol Use: Yes     Comment: ocassionally    Review of Systems    Allergies  Peanut-containing drug products  Home Medications   Prior to Admission medications   Medication Sig Start Date End Date Taking? Authorizing Provider  EPINEPHrine (EPIPEN) 0.3 mg/0.3 mL DEVI Inject 0.3 mLs (0.3 mg total) into the muscle as needed. 07/28/12  Yes Hope Orlene OchM Neese, NP  docusate sodium (COLACE) 100 MG capsule Take 1 capsule (100 mg total) by mouth every 12 (twelve) hours. Patient not taking: Reported on 05/24/2014 03/20/14   Eber HongBrian Miller, MD  oxyCODONE-acetaminophen (PERCOCET) 5-325 MG per tablet Take 1 tablet by  mouth every 4 (four) hours as needed. Patient not taking: Reported on 03/20/2014 04/26/13   Donnetta HutchingBrian Cook, MD  promethazine (PHENERGAN) 25 MG tablet Take 1 tablet (25 mg total) by mouth every 6 (six) hours as needed. Patient not taking: Reported on 03/20/2014 04/26/13   Donnetta HutchingBrian Cook, MD   BP 121/80 mmHg  Pulse 92  Temp(Src) 98.3 F (36.8 C) (Oral)  Resp 20  Ht 6' (1.829 m)  Wt 175 lb (79.379 kg)  BMI 23.73 kg/m2  SpO2 100% Physical Exam  Constitutional: He is oriented to person, place, and time. Vital signs are normal. He appears well-developed and well-nourished.  Non-toxic appearance. He does not appear ill. No distress.  HENT:  Head: Normocephalic and atraumatic.  Nose: Nose normal.  Mouth/Throat: Oropharynx is clear and moist. No oropharyngeal exudate.  Eyes: Conjunctivae and EOM are normal. Pupils are equal, round, and reactive to light. No scleral icterus.  Neck: Normal range of motion. Neck supple. No tracheal deviation, no edema, no erythema and normal range of motion present. No thyroid mass and no thyromegaly present.  Cardiovascular: Normal rate, regular rhythm, S1 normal, S2 normal, normal heart sounds, intact distal pulses and normal pulses.  Exam reveals no gallop and no friction rub.   No murmur heard. Pulses:      Radial pulses are 2+ on the right side, and 2+ on the left side.       Dorsalis pedis pulses are 2+ on the right side, and 2+  on the left side.  Pulmonary/Chest: Effort normal and breath sounds normal. No respiratory distress. He has no wheezes. He has no rhonchi. He has no rales.  Abdominal: Soft. Normal appearance and bowel sounds are normal. He exhibits no distension, no ascites and no mass. There is no hepatosplenomegaly. There is no tenderness. There is no rebound, no guarding and no CVA tenderness.  Musculoskeletal: Normal range of motion. He exhibits no edema or tenderness.  Lymphadenopathy:    He has no cervical adenopathy.  Neurological: He is alert and  oriented to person, place, and time. He has normal strength. No cranial nerve deficit or sensory deficit.  Skin: Skin is warm, dry and intact. No petechiae and no rash noted. He is not diaphoretic. No erythema. No pallor.  Psychiatric: He has a normal mood and affect. His behavior is normal. Judgment normal.  Nursing note and vitals reviewed.   ED Course  Procedures (including critical care time) Labs Review Labs Reviewed  COMPREHENSIVE METABOLIC PANEL - Abnormal; Notable for the following:    Glucose, Bld 101 (*)    All other components within normal limits  CBC WITH DIFFERENTIAL/PLATELET  LIPASE, BLOOD  POC OCCULT BLOOD, ED    Imaging Review No results found.   EKG Interpretation None      MDM   Final diagnoses:  Rectal bleeding   patient since emergency department for rectal bleeding. Hemoglobin here is normal at 14.8. Hemoccult is positive. Patient states his hemoglobin has never significantly fallen. He needs an outpatient colonoscopy which was explained to him. He states he will make his appointment this time. He is in no acute distress appears comfortable in the room. His vital signs remain within his normal limits and he is safe for discharge.    [name], MD 07/08/14 [phone]

## 2014-07-10 LAB — POC OCCULT BLOOD, ED
Fecal Occult Bld: POSITIVE — AB
Fecal Occult Bld: POSITIVE — AB

## 2014-09-27 ENCOUNTER — Other Ambulatory Visit: Payer: Self-pay | Admitting: General Surgery

## 2014-09-27 NOTE — H&P (Signed)
History of Present Illness Ryan Small(Shashana Fullington MD; 09/27/2014 2:39 PM) Patient words: fistula.  The patient is a 19 year old male who presents with anal fistula. The patient is a 19 year old male who presents with anal fistula. He has a history of intermittent bloody and purulent drainage and discomfort for over a year. Was seen in the urgent office with a slightly fluctuant draining area in the left anterior anus consistent with partially drained abscess or fistula. He was seen by Dr. Johna SheriffHoxworth and placed on antibiotics. He is here today for evaluation. He currently is not noticing any drainage. He denies any pain as well. He states he has regular bowel movements and denies any history of constipation or diarrhea. 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)  Other Problems Ryan Small(Leighla Chestnutt, MD; 09/27/2014 2:40 PM) No pertinent past medical history  Past Surgical History Ryan Small(Donal Lynam, MD; 09/27/2014 2:40 PM) No pertinent past surgical history  Diagnostic Studies History Ryan Small(Makynna Manocchio, MD; 09/27/2014 2:40 PM) Colonoscopy never  Allergies Michel Bickers(Kelly Dockery, LPN; 6/04/54097/13/2016 8:112:20 PM) Peanut Oil *CHEMICALS*  Medication History Michel Bickers(Kelly Dockery, LPN; 9/14/78297/13/2016 5:622:20 PM) Augmentin (875-125MG  Tablet, 1 (one) Tablet Oral two times daily, Taken starting 09/19/2014) Active. EPINEPHrine (0.3MG /0.3ML Soln Auto-inj, Injection) Active. Medications Reconciled  Social History Ryan Small(Mesha Schamberger, MD; 09/27/2014 2:40 PM) Tobacco use Former smoker. Illicit drug use Prefer to discuss with provider. Caffeine use Carbonated beverages. Alcohol use Occasional alcohol use.  Family History Ryan Small(Quinn Bartling, MD; 09/27/2014 2:40 PM) Diabetes Mellitus Mother. Hypertension Mother.     Review of Systems Ryan Small(Dorene Bruni MD; 09/27/2014 2:40 PM) General Not Present- Appetite Loss, Chills, Fatigue, Fever, Night Sweats, Weight Gain and  Weight Loss. Skin Not Present- Change in Wart/Mole, Dryness, Hives, Jaundice, New Lesions, Non-Healing Wounds, Rash and Ulcer. HEENT Not Present- Earache, Hearing Loss, Hoarseness, Nose Bleed, Oral Ulcers, Ringing in the Ears, Seasonal Allergies, Sinus Pain, Sore Throat, Visual Disturbances, Wears glasses/contact lenses and Yellow Eyes. Respiratory Not Present- Bloody sputum, Chronic Cough, Difficulty Breathing, Snoring and Wheezing. Breast Not Present- Breast Mass, Breast Pain, Nipple Discharge and Skin Changes. Cardiovascular Not Present- Chest Pain, Difficulty Breathing Lying Down, Leg Cramps, Palpitations, Rapid Heart Rate, Shortness of Breath and Swelling of Extremities. Gastrointestinal Present- Bloody Stool, Hemorrhoids and Rectal Pain. Not Present- Abdominal Pain, Bloating, Change in Bowel Habits, Chronic diarrhea, Constipation, Difficulty Swallowing, Excessive gas, Gets full quickly at meals, Indigestion, Nausea and Vomiting. Male Genitourinary Not Present- Blood in Urine, Change in Urinary Stream, Frequency, Impotence, Nocturia, Painful Urination, Urgency and Urine Leakage.  Vitals Tresa Endo(Kelly Dockery LPN; 1/30/86577/13/2016 8:462:20 PM) 09/27/2014 2:19 PM Weight: 181.2 lb Height: 72in Body Surface Area: 2.04 m Body Mass Index: 24.57 kg/m Temp.: 98.54F(Oral)  Pulse: 88 (Regular)  BP: 124/74 (Sitting, Left Arm, Standard)     Physical Exam Ryan Small(Jeymi Hepp MD; 09/27/2014 2:36 PM)  General Mental Status-Alert. General Appearance-Consistent with stated age. Hydration-Well hydrated. Voice-Normal.  Chest and Lung Exam Chest and lung exam reveals -quiet, even and easy respiratory effort with no use of accessory muscles and on auscultation, normal breath sounds, no adventitious sounds and normal vocal resonance. Inspection Chest Wall - Normal. Back - normal.  Cardiovascular Cardiovascular examination reveals -normal heart sounds, regular rate and rhythm with no murmurs and  normal pedal pulses bilaterally.  Abdomen Palpation/Percussion Palpation and Percussion of the abdomen reveal - Soft and Non Tender.  Male Genitourinary Note: External exam reveals no thrombosed hemorrhoids, no external hemorrhoids, there is  an area at approximately the 4 o'clock position with granulation tissue. There is no erythema or drainage.   Rectal Anorectal Exam External - Note: L Ant external opening noted.    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 This is getting better with antibiotics but on exam it is still noted. I suspect that this will return once he has completed the antibiotics. I recommended exam under anesthesia with fistulotomy versus seton placement. We discussed this in detail, including indications for seton and risk and benefits and recurrence rates of each procedure. I believe he understands this and has agreed to proceed with surgery. 

## 2014-10-03 ENCOUNTER — Encounter (INDEPENDENT_AMBULATORY_CARE_PROVIDER_SITE_OTHER): Payer: Self-pay | Admitting: *Deleted

## 2014-10-24 ENCOUNTER — Ambulatory Visit (INDEPENDENT_AMBULATORY_CARE_PROVIDER_SITE_OTHER): Payer: Medicaid Other | Admitting: Internal Medicine

## 2014-10-24 ENCOUNTER — Encounter (HOSPITAL_BASED_OUTPATIENT_CLINIC_OR_DEPARTMENT_OTHER): Payer: Self-pay | Admitting: *Deleted

## 2014-10-24 NOTE — Progress Notes (Signed)
NPO AFTER MN.  ARRIVE AT 0600.  NEEDS HG.  

## 2014-10-27 ENCOUNTER — Encounter (HOSPITAL_BASED_OUTPATIENT_CLINIC_OR_DEPARTMENT_OTHER)
Admission: RE | Disposition: A | Discharge status: Home / Self Care | Payer: Self-pay | Source: Ambulatory Visit | Attending: General Surgery

## 2014-10-27 ENCOUNTER — Ambulatory Visit (HOSPITAL_BASED_OUTPATIENT_CLINIC_OR_DEPARTMENT_OTHER)
Admission: RE | Admit: 2014-10-27 | Discharge: 2014-10-27 | Disposition: A | Discharge status: Home / Self Care | Payer: Medicaid Other | Source: Ambulatory Visit | Attending: General Surgery | Admitting: General Surgery

## 2014-10-27 ENCOUNTER — Ambulatory Visit (HOSPITAL_BASED_OUTPATIENT_CLINIC_OR_DEPARTMENT_OTHER): Payer: Medicaid Other | Admitting: Anesthesiology

## 2014-10-27 ENCOUNTER — Encounter (HOSPITAL_BASED_OUTPATIENT_CLINIC_OR_DEPARTMENT_OTHER): Payer: Self-pay

## 2014-10-27 DIAGNOSIS — Z792 Long term (current) use of antibiotics: Secondary | ICD-10-CM | POA: Insufficient documentation

## 2014-10-27 DIAGNOSIS — Z87891 Personal history of nicotine dependence: Secondary | ICD-10-CM | POA: Insufficient documentation

## 2014-10-27 DIAGNOSIS — K603 Anal fistula: Secondary | ICD-10-CM | POA: Diagnosis present

## 2014-10-27 HISTORY — PX: EVALUATION UNDER ANESTHESIA WITH ANAL FISTULECTOMY: SHX5621

## 2014-10-27 HISTORY — DX: Anal fistula, unspecified: K60.30

## 2014-10-27 HISTORY — DX: Anal fistula: K60.3

## 2014-10-27 LAB — POCT HEMOGLOBIN-HEMACUE: Hemoglobin: 14.1 g/dL (ref 13.0–17.0)

## 2014-10-27 SURGERY — EXAM UNDER ANESTHESIA WITH ANAL FISTULECTOMY
Anesthesia: Monitor Anesthesia Care | Site: Anus

## 2014-10-27 MED ORDER — SODIUM CHLORIDE 0.9 % IJ SOLN
3.0000 mL | INTRAMUSCULAR | Status: DC | PRN
  Filled 2014-10-27: qty 3

## 2014-10-27 MED ORDER — SODIUM CHLORIDE 0.9 % IJ SOLN
3.0000 mL | Freq: Two times a day (BID) | INTRAMUSCULAR | Status: DC
  Filled 2014-10-27: qty 3

## 2014-10-27 MED ORDER — ACETAMINOPHEN 650 MG RE SUPP
650.0000 mg | RECTAL | Status: DC | PRN
  Filled 2014-10-27: qty 1

## 2014-10-27 MED ORDER — MIDAZOLAM HCL 2 MG/2ML IJ SOLN
INTRAMUSCULAR | Status: AC
  Filled 2014-10-27: qty 2

## 2014-10-27 MED ORDER — FENTANYL CITRATE (PF) 100 MCG/2ML IJ SOLN
INTRAMUSCULAR | Status: AC
  Filled 2014-10-27: qty 4

## 2014-10-27 MED ORDER — MIDAZOLAM HCL 5 MG/5ML IJ SOLN
INTRAMUSCULAR | Status: DC | PRN
  Administered 2014-10-27: 2 mg via INTRAVENOUS

## 2014-10-27 MED ORDER — SODIUM CHLORIDE 0.9 % IR SOLN
Status: DC | PRN
  Administered 2014-10-27: 500 mL

## 2014-10-27 MED ORDER — SODIUM CHLORIDE 0.9 % IV SOLN
250.0000 mL | INTRAVENOUS | Status: DC | PRN
Start: 2014-10-27 — End: 2014-10-27
  Filled 2014-10-27: qty 250

## 2014-10-27 MED ORDER — FENTANYL CITRATE (PF) 100 MCG/2ML IJ SOLN
INTRAMUSCULAR | Status: DC | PRN
  Administered 2014-10-27: 50 ug via INTRAVENOUS

## 2014-10-27 MED ORDER — PROPOFOL 500 MG/50ML IV EMUL
INTRAVENOUS | Status: DC | PRN
  Administered 2014-10-27: 140 ug/kg/min via INTRAVENOUS

## 2014-10-27 MED ORDER — BUPIVACAINE-EPINEPHRINE 0.5% -1:200000 IJ SOLN
INTRAMUSCULAR | Status: DC | PRN
  Administered 2014-10-27: 20 mL

## 2014-10-27 MED ORDER — LACTATED RINGERS IV SOLN
INTRAVENOUS | Status: DC
  Administered 2014-10-27 (×3): via INTRAVENOUS
  Filled 2014-10-27: qty 1000

## 2014-10-27 MED ORDER — PROMETHAZINE HCL 25 MG/ML IJ SOLN
6.2500 mg | INTRAMUSCULAR | Status: DC | PRN
  Filled 2014-10-27: qty 1

## 2014-10-27 MED ORDER — ACETAMINOPHEN 325 MG PO TABS
650.0000 mg | ORAL_TABLET | ORAL | Status: DC | PRN
  Filled 2014-10-27: qty 2

## 2014-10-27 MED ORDER — FENTANYL CITRATE (PF) 100 MCG/2ML IJ SOLN
25.0000 ug | INTRAMUSCULAR | Status: DC | PRN
  Filled 2014-10-27: qty 1

## 2014-10-27 SURGICAL SUPPLY — 63 items
APL SKNCLS STERI-STRIP NONHPOA (GAUZE/BANDAGES/DRESSINGS) ×4
BENZOIN TINCTURE PRP APPL 2/3 (GAUZE/BANDAGES/DRESSINGS) ×8 IMPLANT
BLADE HEX COATED 2.75 (ELECTRODE) ×4 IMPLANT
BLADE SURG 10 STRL SS (BLADE) ×4 IMPLANT
BLADE SURG 15 STRL LF DISP TIS (BLADE) ×2 IMPLANT
BLADE SURG 15 STRL SS (BLADE) ×4
BRIEF STRETCH FOR OB PAD LRG (UNDERPADS AND DIAPERS) ×8 IMPLANT
CANISTER SUCTION 2500CC (MISCELLANEOUS) ×4 IMPLANT
CLOTH BEACON ORANGE TIMEOUT ST (SAFETY) ×4 IMPLANT
COVER BACK TABLE 60X90IN (DRAPES) ×4 IMPLANT
COVER MAYO STAND STRL (DRAPES) ×4 IMPLANT
DECANTER SPIKE VIAL GLASS SM (MISCELLANEOUS) IMPLANT
DRAPE LG THREE QUARTER DISP (DRAPES) ×4 IMPLANT
DRAPE PED LAPAROTOMY (DRAPES) ×4 IMPLANT
DRAPE UNDERBUTTOCKS STRL (DRAPE) IMPLANT
DRAPE UTILITY XL STRL (DRAPES) ×4 IMPLANT
DRSG PAD ABDOMINAL 8X10 ST (GAUZE/BANDAGES/DRESSINGS) ×4 IMPLANT
ELECT BLADE 6.5 .24CM SHAFT (ELECTRODE) IMPLANT
ELECT REM PT RETURN 9FT ADLT (ELECTROSURGICAL) ×4
ELECTRODE REM PT RTRN 9FT ADLT (ELECTROSURGICAL) ×2 IMPLANT
GAUZE SPONGE 4X4 16PLY XRAY LF (GAUZE/BANDAGES/DRESSINGS) IMPLANT
GAUZE VASELINE 3X9 (GAUZE/BANDAGES/DRESSINGS) IMPLANT
GLOVE BIO SURGEON STRL SZ 6.5 (GLOVE) ×6 IMPLANT
GLOVE BIO SURGEONS STRL SZ 6.5 (GLOVE) ×2
GLOVE INDICATOR 7.0 STRL GRN (GLOVE) ×8 IMPLANT
GLOVE INDICATOR 7.5 STRL GRN (GLOVE) ×8 IMPLANT
GLOVE SURG SS PI 7.5 STRL IVOR (GLOVE) ×4 IMPLANT
GOWN STRL REUS W/ TWL LRG LVL3 (GOWN DISPOSABLE) ×2 IMPLANT
GOWN STRL REUS W/ TWL XL LVL3 (GOWN DISPOSABLE) ×4 IMPLANT
GOWN STRL REUS W/TWL 2XL LVL3 (GOWN DISPOSABLE) ×4 IMPLANT
GOWN STRL REUS W/TWL LRG LVL3 (GOWN DISPOSABLE) ×3
GOWN STRL REUS W/TWL XL LVL3 (GOWN DISPOSABLE) ×8
HOOK RETRACTION 12 ELAST STAY (MISCELLANEOUS) IMPLANT
HYDROGEN PEROXIDE 16OZ (MISCELLANEOUS) ×4 IMPLANT
LEGGING LITHOTOMY PAIR STRL (DRAPES) IMPLANT
LOOP VESSEL MAXI BLUE (MISCELLANEOUS) ×4 IMPLANT
MANIFOLD NEPTUNE II (INSTRUMENTS) IMPLANT
NDL SAFETY ECLIPSE 18X1.5 (NEEDLE) IMPLANT
NEEDLE HYPO 18GX1.5 SHARP (NEEDLE)
NEEDLE HYPO 25X1 1.5 SAFETY (NEEDLE) ×4 IMPLANT
NS IRRIG 500ML POUR BTL (IV SOLUTION) ×4 IMPLANT
PACK BASIN DAY SURGERY FS (CUSTOM PROCEDURE TRAY) ×4 IMPLANT
PAD ABD 8X10 STRL (GAUZE/BANDAGES/DRESSINGS) ×4 IMPLANT
PAD ARMBOARD 7.5X6 YLW CONV (MISCELLANEOUS) ×4 IMPLANT
PENCIL BUTTON HOLSTER BLD 10FT (ELECTRODE) ×4 IMPLANT
RETRACTOR STERILE 25.8CMX11.3 (INSTRUMENTS) IMPLANT
SPONGE GAUZE 4X4 12PLY STER LF (GAUZE/BANDAGES/DRESSINGS) ×4 IMPLANT
SPONGE SURGIFOAM ABS GEL 12-7 (HEMOSTASIS) IMPLANT
SUCTION FRAZIER TIP 10 FR DISP (SUCTIONS) IMPLANT
SUT CHROMIC 2 0 SH (SUTURE) ×4 IMPLANT
SUT CHROMIC 3 0 SH 27 (SUTURE) IMPLANT
SUT ETHIBOND 0 (SUTURE) ×4 IMPLANT
SUT SILK 2 0 (SUTURE)
SUT SILK 2-0 18XBRD TIE 12 (SUTURE) IMPLANT
SUT VIC AB 3-0 SH 18 (SUTURE) IMPLANT
SUT VIC AB 4-0 P-3 18XBRD (SUTURE) IMPLANT
SUT VIC AB 4-0 P3 18 (SUTURE)
SYR CONTROL 10ML LL (SYRINGE) ×4 IMPLANT
TOWEL OR 17X24 6PK STRL BLUE (TOWEL DISPOSABLE) ×4 IMPLANT
TRAY DSU PREP LF (CUSTOM PROCEDURE TRAY) ×4 IMPLANT
TUBE CONNECTING 12'X1/4 (SUCTIONS) ×1
TUBE CONNECTING 12X1/4 (SUCTIONS) ×3 IMPLANT
YANKAUER SUCT BULB TIP NO VENT (SUCTIONS) ×4 IMPLANT

## 2014-10-27 NOTE — Op Note (Signed)
10/27/2014  7:57 AM  PATIENT:  Ryan Small  19 y.o. male  Patient Care Team: Assunta Found, MD as PCP - General (Family Medicine) Corbin Ade, MD as Consulting Physician (Gastroenterology)  PRE-OPERATIVE DIAGNOSIS:  anal fistula  POST-OPERATIVE DIAGNOSIS:  Normal exam  PROCEDURE: ANAL EXAM UNDER ANESTHESIA    Surgeon(s): Romie Levee, MD  ASSISTANT: none   ANESTHESIA:   local and MAC  SPECIMEN:  No Specimen  DISPOSITION OF SPECIMEN:  N/A  COUNTS:  YES  PLAN OF CARE: Discharge to home after PACU  PATIENT DISPOSITION:  PACU - hemodynamically stable.  INDICATION: 19 y.o. M with 3 months of a chronically draining perianal wound.  Exam in office was concerning for fistula.     OR FINDINGS: no signs of fistula on exam, normal appearing anatomy  DESCRIPTION: the patient was identified in the preoperative holding area and taken to the OR where they were laid on the operating room table.  MAC anesthesia was induced without difficulty. The patient was then positioned in prone jackknife position with buttocks gently taped apart.  The patient was then prepped and draped in usual sterile fashion.  SCDs were noted to be in place prior to the initiation of anesthesia. A surgical timeout was performed indicating the correct patient, procedure, positioning and need for preoperative antibiotics.  A rectal block was performed using Marcaine with epinephrine.    I began with a digital rectal exam.  There were no palpable masses.  I then placed a Hill-Ferguson anoscope into the anal canal and evaluated this completely.  There was some friable anal tissue on the left side, but no signs of internal opening or crypto glandular disease.  I evaluated the external region extensively, especially in the left anterior region where it was seen in the office.  There were no skin defects noted.  There was no palpable cord.  There was a small area that felt like scar tissue but no tunneling could be  obtained with a fistula probe.   I examined the remaining perianal tissue.  This was all normal as well.  At this point I decided to complete the exam.  The patient was awakened from anesthesia and sent to the PACU in stable condition.

## 2014-10-27 NOTE — Transfer of Care (Signed)
Immediate Anesthesia Transfer of Care Note  Patient: Ryan Small  Procedure(s) Performed: Procedure(s) (LRB): EXAM UNDER ANESTHESIA  (N/A)  Patient Location: PACU  Anesthesia Type: MAC  Level of Consciousness: awake, alert , oriented and patient cooperative  Airway & Oxygen Therapy: Patient Spontanous Breathing and Patient connected to face mask oxygen  Post-op Assessment: Report given to PACU RN and Post -op Vital signs reviewed and stable  Post vital signs: Reviewed and stable  Complications: No apparent anesthesia complications

## 2014-10-27 NOTE — H&P (View-Only) (Signed)
History of Present Illness Ryan Small(Mayline Dragon MD; 09/27/2014 2:39 PM) Patient words: fistula.  The patient is a 19 year old male who presents with anal fistula. The patient is a 19 year old male who presents with anal fistula. He has a history of intermittent bloody and purulent drainage and discomfort for over a year. Was seen in the urgent office with a slightly fluctuant draining area in the left anterior anus consistent with partially drained abscess or fistula. He was seen by Dr. Johna SheriffHoxworth and placed on antibiotics. He is here today for evaluation. He currently is not noticing any drainage. He denies any pain as well. He states he has regular bowel movements and denies any history of constipation or diarrhea. 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 EPINEPHrine (0.3MG /0.3ML Soln Auto-inj, Injection) Active. Medications Reconciled  Social History Ryan Small(Mala Gibbard, MD; 09/27/2014 2:40 PM) Tobacco use Former smoker. Illicit drug use Prefer to discuss with provider. Caffeine use Carbonated beverages. Alcohol use Occasional alcohol use.  Family History Ryan Small(Jazlen Ogarro, MD; 09/27/2014 2:40 PM) Diabetes Mellitus Mother. Hypertension Mother.     Review of Systems Ryan Small(Nikaya Nasby MD; 09/27/2014 2:40 PM) General Not Present- Appetite Loss, Chills, Fatigue, Fever, Night Sweats, Weight Gain and  Weight Loss. Skin Not Present- Change in Wart/Mole, Dryness, Hives, Jaundice, New Lesions, Non-Healing Wounds, Rash and Ulcer. HEENT Not Present- Earache, Hearing Loss, Hoarseness, Nose Bleed, Oral Ulcers, Ringing in the Ears, Seasonal Allergies, Sinus Pain, Sore Throat, Visual Disturbances, Wears glasses/contact lenses and Yellow Eyes. Respiratory Not Present- Bloody sputum, Chronic Cough, Difficulty Breathing, Snoring and Wheezing. Breast Not Present- Breast Mass, Breast Pain, Nipple Discharge and Skin Changes. Cardiovascular Not Present- Chest Pain, Difficulty Breathing Lying Down, Leg Cramps, Palpitations, Rapid Heart Rate, Shortness of Breath and Swelling of Extremities. Gastrointestinal Present- Bloody Stool, Hemorrhoids and Rectal Pain. Not Present- Abdominal Pain, Bloating, Change in Bowel Habits, Chronic diarrhea, Constipation, Difficulty Swallowing, Excessive gas, Gets full quickly at meals, Indigestion, Nausea and Vomiting. Male Genitourinary Not Present- Blood in Urine, Change in Urinary Stream, Frequency, Impotence, Nocturia, Painful Urination, Urgency and Urine Leakage.  Vitals Tresa Endo(Kelly Dockery LPN; 1/30/86577/13/2016 8:462:20 PM) 09/27/2014 2:19 PM Weight: 181.2 lb Height: 72in Body Surface Area: 2.04 m Body Mass Index: 24.57 kg/m Temp.: 98.54F(Oral)  Pulse: 88 (Regular)  BP: 124/74 (Sitting, Left Arm, Standard)     Physical Exam Ryan Small(Ceria Suminski MD; 09/27/2014 2:36 PM)  General Mental Status-Alert. General Appearance-Consistent with stated age. Hydration-Well hydrated. Voice-Normal.  Chest and Lung Exam Chest and lung exam reveals -quiet, even and easy respiratory effort with no use of accessory muscles and on auscultation, normal breath sounds, no adventitious sounds and normal vocal resonance. Inspection Chest Wall - Normal. Back - normal.  Cardiovascular Cardiovascular examination reveals -normal heart sounds, regular rate and rhythm with no murmurs and  normal pedal pulses bilaterally.  Abdomen Palpation/Percussion Palpation and Percussion of the abdomen reveal - Soft and Non Tender.  Male Genitourinary Note: External exam reveals no thrombosed hemorrhoids, no external hemorrhoids, there is  an area at approximately the 4 o'clock position with granulation tissue. There is no erythema or drainage.   Rectal Anorectal Exam External - Note: L Ant external opening noted.    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 This is getting better with antibiotics but on exam it is still noted. I suspect that this will return once he has completed the antibiotics. I recommended exam under anesthesia with fistulotomy versus seton placement. We discussed this in detail, including indications for seton and risk and benefits and recurrence rates of each procedure. I believe he understands this and has agreed to proceed with surgery.

## 2014-10-27 NOTE — Interval H&P Note (Signed)
History and Physical Interval Note:  10/27/2014 7:18 AM  Ryan Small  has presented today for surgery, with the diagnosis of anal fistula  The various methods of treatment have been discussed with the patient and family. After consideration of risks, benefits and other options for treatment, the patient has consented to  Procedure(s): EXAM UNDER ANESTHESIA WITH POSSIBLE  FISTULOTOMY (N/A) POSSIBLE PLACEMENT OF SETON (N/A) as a surgical intervention .  The patient's history has been reviewed, patient examined, no change in status, stable for surgery.  He understands there is a slight risk of incontinence if a fistulotomy is performed.  I have reviewed the patient's chart and labs.  Questions were answered to the patient's satisfaction.     Vanita Panda, MD  Colorectal and General Surgery [facility]

## 2014-10-27 NOTE — Anesthesia Postprocedure Evaluation (Signed)
  Anesthesia Post-op Note  Patient: Ryan Small  Procedure(s) Performed: Procedure(s) (LRB): EXAM UNDER ANESTHESIA  (N/A)  Patient Location: PACU  Anesthesia Type: MAC  Level of Consciousness: awake and alert   Airway and Oxygen Therapy: Patient Spontanous Breathing  Post-op Pain: mild  Post-op Assessment: Post-op Vital signs reviewed, Patient's Cardiovascular Status Stable, Respiratory Function Stable, Patent Airway and No signs of Nausea or vomiting  Last Vitals:  Filed Vitals:   10/27/14 0942  BP: 118/87  Pulse: 73  Temp: 36.7 C  Resp: 16    Post-op Vital Signs: stable   Complications: No apparent anesthesia complications

## 2014-10-27 NOTE — Discharge Instructions (Addendum)
Beginning the day after surgery:  You may sit in a tub of warm water 2-3 times a day to relieve any discomfort.  Eat a regular diet high in fiber.  Avoid foods that give you constipation or diarrhea.  Avoid foods that are difficult to digest, such as seeds, nuts, corn or popcorn.  Do not go any longer than 2 days without a bowel movement.  You may take a dose of Milk of Magnesia if you become constipated.    Drink 6-8 glasses of water daily.  Walking is encouraged.  Avoid strenuous activity and heavy lifting for one month after surgery.    Call the office if you have any questions or concerns.  Call immediately if you develop:   Excessive rectal bleeding (more than a cup or passing large clots)  Increased discomfort  Fever greater than 100 F Difficulty urinating Post Anesthesia Home Care Instructions  Activity: Get plenty of rest for the remainder of the day. A responsible adult should stay with you for 24 hours following the procedure.  For the next 24 hours, DO NOT: -Drive a car -Advertising copywriter -Drink alcoholic beverages -Take any medication unless instructed by your physician -Make any legal decisions or sign important papers.  Meals: Start with liquid foods such as gelatin or soup. Progress to regular foods as tolerated. Avoid greasy, spicy, heavy foods. If nausea and/or vomiting occur, drink only clear liquids until the nausea and/or vomiting subsides. Call your physician if vomiting continues.  Special Instructions/Symptoms: Your throat may feel dry or sore from the anesthesia or the breathing tube placed in your throat during surgery. If this causes discomfort, gargle with warm salt water. The discomfort should disappear within 24 hours.  If you had a scopolamine patch placed behind your ear for the management of post- operative nausea and/or vomiting:  1. The medication in the patch is effective for 72 hours, after which it should be removed.  Wrap patch in a tissue  and discard in the trash. Wash hands thoroughly with soap and water. 2. You may remove the patch earlier than 72 hours if you experience unpleasant side effects which may include dry mouth, dizziness or visual disturbances. 3. Avoid touching the patch. Wash your hands with soap and water after contact with the patch.

## 2014-10-27 NOTE — Anesthesia Preprocedure Evaluation (Addendum)
Anesthesia Evaluation  Patient identified by MRN, date of birth, ID band Patient awake    Reviewed: Allergy & Precautions, NPO status , Patient's Chart, lab work & pertinent test results  Airway Mallampati: II  TM Distance: >3 FB Neck ROM: Full    Dental no notable dental hx.    Pulmonary neg pulmonary ROS,  breath sounds clear to auscultation  Pulmonary exam normal       Cardiovascular negative cardio ROS Normal cardiovascular examRhythm:Regular Rate:Normal     Neuro/Psych  Headaches, negative psych ROS   GI/Hepatic negative GI ROS, Neg liver ROS,   Endo/Other  negative endocrine ROS  Renal/GU negative Renal ROS  negative genitourinary   Musculoskeletal negative musculoskeletal ROS (+)   Abdominal   Peds negative pediatric ROS (+)  Hematology negative hematology ROS (+)   Anesthesia Other Findings   Reproductive/Obstetrics negative OB ROS                             Anesthesia Physical Anesthesia Plan  ASA: II  Anesthesia Plan: MAC   Post-op Pain Management:    Induction: Intravenous  Airway Management Planned: Natural Airway  Additional Equipment:   Intra-op Plan:   Post-operative Plan:   Informed Consent: I have reviewed the patients History and Physical, chart, labs and discussed the procedure including the risks, benefits and alternatives for the proposed anesthesia with the patient or authorized representative who has indicated his/her understanding and acceptance.   Dental advisory given  Plan Discussed with: CRNA  Anesthesia Plan Comments:         Anesthesia Quick Evaluation

## 2014-10-30 ENCOUNTER — Encounter (HOSPITAL_BASED_OUTPATIENT_CLINIC_OR_DEPARTMENT_OTHER): Payer: Self-pay | Admitting: General Surgery

## 2014-11-15 ENCOUNTER — Encounter (INDEPENDENT_AMBULATORY_CARE_PROVIDER_SITE_OTHER): Payer: Self-pay | Admitting: *Deleted

## 2015-05-02 ENCOUNTER — Encounter (HOSPITAL_COMMUNITY): Payer: Self-pay | Admitting: *Deleted

## 2015-05-02 ENCOUNTER — Emergency Department (HOSPITAL_COMMUNITY)
Admission: EM | Admit: 2015-05-02 | Discharge: 2015-05-02 | Disposition: A | Discharge status: Home / Self Care | Payer: Medicaid Other | Attending: Emergency Medicine | Admitting: Emergency Medicine

## 2015-05-02 DIAGNOSIS — Z8719 Personal history of other diseases of the digestive system: Secondary | ICD-10-CM | POA: Diagnosis not present

## 2015-05-02 DIAGNOSIS — L03115 Cellulitis of right lower limb: Secondary | ICD-10-CM | POA: Diagnosis not present

## 2015-05-02 DIAGNOSIS — L02415 Cutaneous abscess of right lower limb: Secondary | ICD-10-CM | POA: Diagnosis present

## 2015-05-02 MED ORDER — CEPHALEXIN 500 MG PO CAPS: 1000.0000 mg | ORAL_CAPSULE | Freq: Two times a day (BID) | ORAL | Status: DC

## 2015-05-02 NOTE — ED Provider Notes (Signed)
CSN: [id number]     Arrival date & time 05/02/15  1714 History  By signing my name below, I, Soijett Blue, attest that this documentation has been prepared under the direction and in the presence of Fayrene Helper, PA-C Electronically Signed: Soijett Blue, ED Scribe. 05/02/2015. 5:36 PM.   Chief Complaint  Patient presents with  . Abscess      The history is provided by the patient. No language interpreter was used.    Ryan Small is a 20 y.o. male who presents to the Emergency Department complaining of 7/10, sharp/throbbing, right thigh abscess onset 4 days ago. He notes that the abscess began as a small bump to which he contributed to a mosquito bite. Denies injury. Pt reports that his tetanus is UTD at this time.  He states that he has tried warm compresses/soaks without medications for the relief of his symptoms. He denies fever, chills, drainage, and any other symptoms. Denies allergies to medications.   Past Medical History  Diagnosis Date  . Anal fistula    Past Surgical History  Procedure Laterality Date  . Wisdom tooth extraction    . Evaluation under anesthesia with anal fistulectomy N/A 10/27/2014    Procedure: EXAM UNDER ANESTHESIA ;  Surgeon: Romie Levee, MD;  Location: [facility];  Service: General;  Laterality: N/A;   Family History  Problem Relation Age of Onset  . Diabetes Mother    Social History  Substance Use Topics  . Smoking status: Never Smoker   . Smokeless tobacco: Never Used  . Alcohol Use: Yes     Comment: ocassionally    Review of Systems  Constitutional: Negative for fever and chills.  Skin:       Abscess to right upper thigh without any drainage      Allergies  Peanut-containing drug products  Home Medications   Prior to Admission medications   Medication Sig Start Date End Date Taking? Authorizing Provider  cephALEXin (KEFLEX) 500 MG capsule Take 2 capsules (1,000 mg total) by mouth 2 (two) times daily. 05/02/15    Fayrene Helper, PA-C   BP 120/83 mmHg  Pulse 83  Temp(Src) 97.6 F (36.4 C) (Oral)  Resp 16  Ht  (1.803 m)  Wt 170 lb (77.111 kg)  BMI 23.72 kg/m2  SpO2 98% Physical Exam  Physical Exam  Constitutional: Pt is oriented to person, place, and time. Pt appears well-developed and well-nourished. No distress.  HENT:  Head: Normocephalic and atraumatic.  Eyes: Conjunctivae are normal. No scleral icterus.  Neck: Normal range of motion.  Cardiovascular: Normal rate, regular rhythm and intact distal pulses.   Pulmonary/Chest: Effort normal and breath sounds normal.  Abdominal: Soft. Pt exhibits no distension. There is no tenderness.  Lymphadenopathy:    Pt has no cervical adenopathy.  Neurological: Pt is alert and oriented to person, place, and time.  Skin: Skin is warm and dry. Pt is not diaphoretic. Right thigh with a 0.25 cm small area of erythema and warmth that is TTP with mild induration, but no fluctuance. Psychiatric: Pt has a normal mood and affect.  Nursing note and vitals reviewed.    ED Course  Procedures (including critical care time) DIAGNOSTIC STUDIES: Oxygen Saturation is 98% on RA, nl by my interpretation.    COORDINATION OF CARE: 5:31 PM Discussed treatment plan with pt at bedside and pt agreed to plan.    MDM   Final diagnoses:  Cellulitis of right thigh   This is cellulitis  and potential earl abscess to the right thigh. Not amendable for drainage at this time as there is no fluctuance. Pt is to receive abx Rx, warm compresses and to return in 48 hours for a wound recheck.   BP 120/83 mmHg  Pulse 83  Temp(Src) 97.6 F (36.4 C) (Oral)  Resp 16  Ht $RemoveBef 3 m)  Wt 77.111 kg  BMI 23.72 kg/m2  SpO2 98%   I personally performed the services described in this documentation, which was scribed in my presence. The recorded information has been reviewed and is accurate.      Fayrene Helper, PA-C 05/03/15 0130  Glynn Octave, MD 05/03/15 [phone]

## 2015-05-02 NOTE — ED Notes (Signed)
Pt reports a boil to RT upper leg.Pt reports there has not been any drainage.

## 2015-05-02 NOTE — ED Notes (Signed)
Declined W/C at D/C and was escorted to lobby by RN. 

## 2015-05-02 NOTE — Discharge Instructions (Signed)
You have a skin infection and potential early abscess.  Please apply warm moist compress to the affected area 5-7 times a day.  Take antibiotic as prescribed for the full duration.  Take tylenol or ibuprofen as needed for pain.  Return in 48 hrs if no improvement.   Cellulitis Cellulitis is an infection of the skin and the tissue beneath it. The infected area is usually red and tender. Cellulitis occurs most often in the arms and lower legs.  CAUSES  Cellulitis is caused by bacteria that enter the skin through cracks or cuts in the skin. The most common types of bacteria that cause cellulitis are staphylococci and streptococci. SIGNS AND SYMPTOMS   Redness and warmth.  Swelling.  Tenderness or pain.  Fever. DIAGNOSIS  Your health care provider can usually determine what is wrong based on a physical exam. Blood tests may also be done. TREATMENT  Treatment usually involves taking an antibiotic medicine. HOME CARE INSTRUCTIONS   Take your antibiotic medicine as directed by your health care provider. Finish the antibiotic even if you start to feel better.  Keep the infected arm or leg elevated to reduce swelling.  Apply a warm cloth to the affected area up to 4 times per day to relieve pain.  Take medicines only as directed by your health care provider.  Keep all follow-up visits as directed by your health care provider. SEEK MEDICAL CARE IF:   You notice red streaks coming from the infected area.  Your red area gets larger or turns dark in color.  Your bone or joint underneath the infected area becomes painful after the skin has healed.  Your infection returns in the same area or another area.  You notice a swollen bump in the infected area.  You develop new symptoms.  You have a fever. SEEK IMMEDIATE MEDICAL CARE IF:   You feel very sleepy.  You develop vomiting or diarrhea.  You have a general ill feeling (malaise) with muscle aches and pains.   This information is  not intended to replace advice given to you by your health care provider. Make sure you discuss any questions you have with your health care provider.   Document Released: 12/11/2004 Document Revised: 11/22/2014 Document Reviewed: 05/19/2011 Elsevier Interactive Patient Education [employer]

## 2015-10-08 ENCOUNTER — Encounter (HOSPITAL_COMMUNITY): Payer: Self-pay | Admitting: Emergency Medicine

## 2015-10-08 ENCOUNTER — Emergency Department (HOSPITAL_COMMUNITY)
Admission: EM | Admit: 2015-10-08 | Discharge: 2015-10-08 | Disposition: A | Discharge status: Home / Self Care | Payer: Medicaid Other | Attending: Emergency Medicine | Admitting: Emergency Medicine

## 2015-10-08 DIAGNOSIS — R21 Rash and other nonspecific skin eruption: Secondary | ICD-10-CM | POA: Insufficient documentation

## 2015-10-08 MED ORDER — HYDROCORTISONE 1 % EX CREA: TOPICAL_CREAM | CUTANEOUS | 0 refills | Status: AC

## 2015-10-08 NOTE — Discharge Instructions (Signed)
We do not know specifically what is causing your rash, but it does appear to be an allergic reaction rather than an infection or an indication of a more severe underlying illness.  Please take any prescribed medications as written and continue taking your normal prescription medications unless otherwise specified.  Follow up with the recommended physicians and return to the emergency department with any new or worsening symptoms that concern you, including but not limited to fever, lesions inside your mouth, etc. ° °

## 2015-10-08 NOTE — ED Provider Notes (Signed)
Emergency Department Provider Note  Time seen: Approximately 9:14 PM  I have reviewed the triage vital signs and the nursing notes.   HISTORY  Chief Complaint Insect Bite   HPI Ryan Small is a 20 y.o. male presents to the ED for evaluation of itchy rash to the left posterior forearm. Patient has a history of perirectal abscess but states this feels very different. He noted relatively sudden onset itching and swelling over the raised erythematous area in his arm. He has no known contact allergies. He does have an allergy to peanuts but no known exposure. As any fevers, shaking chills, numbness in the hand or forearm. He notes that it was much bigger yesterday and seems to be decreasing in size with no intervention. No other lesions similar to this on his body. No shortness of breath, tongue swelling, throat discomfort.  Past Medical History:  Diagnosis Date  . Anal fistula     Patient Active Problem List   Diagnosis Date Noted  . Diarrhea 10/31/2011  . Dehydration 10/30/2011  . Hypotension 10/30/2011  . Myalgia 10/30/2011  . Headache(784.0) 10/30/2011    Past Surgical History:  Procedure Laterality Date  . EVALUATION UNDER ANESTHESIA WITH ANAL FISTULECTOMY N/A 10/27/2014   Procedure: EXAM UNDER ANESTHESIA ;  Surgeon: Romie Levee, MD;  Location: [facility];  Service: General;  Laterality: N/A;  . WISDOM TOOTH EXTRACTION        Allergies Peanut-containing drug products  Family History  Problem Relation Age of Onset  . Diabetes Mother     Social History Social History  Substance Use Topics  . Smoking status: Never Smoker  . Smokeless tobacco: Never Used  . Alcohol use Yes     Comment: ocassionally    Review of Systems  Constitutional: No fever/chills Eyes: No visual changes. ENT: No sore throat. Cardiovascular: Denies chest pain. Respiratory: Denies shortness of breath. Gastrointestinal: No abdominal pain.  No nausea, no vomiting.   No diarrhea.  No constipation. Genitourinary: Negative for dysuria. Musculoskeletal: Negative for back pain. Skin: Positive rash Neurological: Negative for headaches, focal weakness or numbness.  10-point ROS otherwise negative.  ____________________________________________   PHYSICAL EXAM:  VITAL SIGNS: ED Triage Vitals  Enc Vitals Group     BP 10/08/15 2018 122/70     Pulse Rate 10/08/15 2018 82     Resp 10/08/15 2018 20     Temp 10/08/15 2018 98.5 F (36.9 C)     Temp src --      SpO2 10/08/15 2018 98 %     Weight 10/08/15 2018 200 lb (90.7 kg)     Height 10/08/15 2018  (1.803 m)     Pain Score 10/08/15 2017 0   Constitutional: Alert and oriented. Well appearing and in no acute distress. Eyes: Conjunctivae are normal. PERRL. EOMI. Head: Atraumatic. Nose: No congestion/rhinnorhea. Mouth/Throat: Mucous membranes are moist.  Oropharynx non-erythematous. Neck: No stridor.  Musculoskeletal: No lower extremity tenderness nor edema. No gross deformities of extremities. Neurologic:  Normal speech and language. No gross focal neurologic deficits are appreciated.  Skin:  Skin is warm, dry and intact. 2 x 3 cm raised erythematous rash over the left posterior forearm. Not warm to touch. No fluctuance. No drainage. No surrounding erythema.  Psychiatric: Mood and affect are normal. Speech and behavior are normal.  ____________________________________________   PROCEDURES  Procedure(s) performed:   Procedures  None ____________________________________________   INITIAL IMPRESSION / ASSESSMENT AND PLAN / ED COURSE  Pertinent  labs & imaging results that were available during my care of the patient were reviewed by me and considered in my medical decision making (see chart for details).  Patient presents to the emergency department for evaluation of rash to his left arm. His most consistent with a contact dermatitis. Low suspicion for underlying abscess or cellulitis.  Rash is improving without any intervention at home. Prescribed small amount of cortisone cream and instructed the patient to take Benadryl for additional itching. Cautioned him on quick return to the emergency department with any respiratory difficulty, throat swelling, sudden worsening of symptoms.    ____________________________________________  FINAL CLINICAL IMPRESSION(S) / ED DIAGNOSES  Final diagnoses:  Rash     MEDICATIONS GIVEN DURING THIS VISIT:  None  NEW OUTPATIENT MEDICATIONS STARTED DURING THIS VISIT:  Discharge Medication List as of 10/08/2015  9:16 PM    START taking these medications   Details  hydrocortisone cream 1 % Apply to affected area 2 times daily, Print         Note:  This document was prepared using Dragon voice recognition software and may include unintentional dictation errors.  Alona Bene, MD Emergency Medicine   Maia Plan, MD 10/08/15 2147

## 2015-10-08 NOTE — ED Triage Notes (Signed)
Pt c/o insect bite to the left forearm that he noticed last night. Pt states it is started to itch now.

## 2015-10-10 ENCOUNTER — Emergency Department (HOSPITAL_COMMUNITY)
Admission: EM | Admit: 2015-10-10 | Discharge: 2015-10-10 | Disposition: A | Discharge status: Home / Self Care | Payer: Self-pay | Attending: Emergency Medicine | Admitting: Emergency Medicine

## 2015-10-10 ENCOUNTER — Encounter (HOSPITAL_COMMUNITY): Payer: Self-pay | Admitting: Emergency Medicine

## 2015-10-10 DIAGNOSIS — Y929 Unspecified place or not applicable: Secondary | ICD-10-CM | POA: Insufficient documentation

## 2015-10-10 DIAGNOSIS — Y999 Unspecified external cause status: Secondary | ICD-10-CM | POA: Insufficient documentation

## 2015-10-10 DIAGNOSIS — W57XXXA Bitten or stung by nonvenomous insect and other nonvenomous arthropods, initial encounter: Secondary | ICD-10-CM | POA: Insufficient documentation

## 2015-10-10 DIAGNOSIS — R21 Rash and other nonspecific skin eruption: Secondary | ICD-10-CM | POA: Insufficient documentation

## 2015-10-10 DIAGNOSIS — Y939 Activity, unspecified: Secondary | ICD-10-CM | POA: Insufficient documentation

## 2015-10-10 DIAGNOSIS — Z79899 Other long term (current) drug therapy: Secondary | ICD-10-CM | POA: Insufficient documentation

## 2015-10-10 MED ORDER — CEPHALEXIN 500 MG PO CAPS: 500.0000 mg | ORAL_CAPSULE | Freq: Four times a day (QID) | ORAL | 0 refills | Status: AC

## 2015-10-10 NOTE — ED Provider Notes (Signed)
[facility] Provider Note   CSN: [id number] Arrival date & time: 10/10/15  1420  First Provider Contact:  First MD Initiated Contact with Patient 10/10/15 1507        History   Chief Complaint Chief Complaint  Patient presents with  . Follow-up    In [facility] on Monday    HPI Ryan Small is a 20 y.o. male.  Patient presents to the ED with a chief complaint of insect bite.  Patient states that he was seen here recently and treated with hydrocortisone cream.  States that he has had some spread of his symptoms and is concerned about infection.  He denies any pain.  Denies any fever.  He reports that the rash/bite is itchy.  There are no other associated symptoms.  There are no modifying factors.   The history is provided by the patient. No language interpreter was used.    Past Medical History:  Diagnosis Date  . Anal fistula     Patient Active Problem List   Diagnosis Date Noted  . Diarrhea 10/31/2011  . Dehydration 10/30/2011  . Hypotension 10/30/2011  . Myalgia 10/30/2011  . Headache(784.0) 10/30/2011    Past Surgical History:  Procedure Laterality Date  . EVALUATION UNDER ANESTHESIA WITH ANAL FISTULECTOMY N/A 10/27/2014   Procedure: EXAM UNDER ANESTHESIA ;  Surgeon: Romie Levee, MD;  Location: [facility] [place];  Service: General;  Laterality: N/A;  . WISDOM TOOTH EXTRACTION         Home Medications    Prior to Admission medications   Medication Sig Start Date End Date Taking? Authorizing Provider  hydrocortisone cream 1 % Apply to affected area 2 times daily 10/08/15   Maia Plan, MD    Family History Family History  Problem Relation Age of Onset  . Diabetes Mother     Social History Social History  Substance Use Topics  . Smoking status: Never Smoker  . Smokeless tobacco: Never Used  . Alcohol use Yes     Comment: ocassionally     Allergies   Peanut-containing drug products   Review of Systems Review of Systems    All other systems reviewed and are negative.    Physical Exam Updated Vital Signs BP 114/78 (BP Location: Left Arm)   Pulse 86   Temp 97.8 F (36.6 C) (Oral)   Resp 16   Ht 5\' 11"  (1.803 m)   Wt 90.7 kg   SpO2 99%   BMI 27.89 kg/m   Physical Exam  Constitutional: He is oriented to person, place, and time. No distress.  HENT:  Head: Normocephalic and atraumatic.  Eyes: Conjunctivae and EOM are normal. Pupils are equal, round, and reactive to light.  Neck: No tracheal deviation present.  Cardiovascular: Normal rate.   Pulmonary/Chest: Effort normal. No respiratory distress.  Abdominal: Soft.  Musculoskeletal: Normal range of motion.  Neurological: He is alert and oriented to person, place, and time.  Skin: Skin is warm and dry. He is not diaphoretic.  Erythema of left posterior forearm, no abscess, possible bites with possible superimposed infection  Psychiatric: Judgment normal.  Nursing note and vitals reviewed.    ED Treatments / Results   Procedures Procedures (including critical care time)  Medications Ordered in ED Medications - No data to display   Initial Impression / Assessment and Plan / ED Course  I have reviewed the triage vital signs and the nursing notes.  Pertinent labs & imaging results that were available during my  care of the patient were reviewed by me and considered in my medical decision making (see chart for details).  Clinical Course    Patient with insect bite vs rash vs mild cellulitis of left forearm.  Tried hydrocortisone with no relief.  Small cover with keflex and recommend continuing treatment.  Could possibly be developing ringworm.  Recommend OTC lotrimin if not better in 5 days.  Final Clinical Impressions(s) / ED Diagnoses   Final diagnoses:  Rash and nonspecific skin eruption    New Prescriptions New Prescriptions   CEPHALEXIN (KEFLEX) 500 MG CAPSULE    Take 1 capsule (500 mg total) by mouth 4 (four) times daily.      Roxy Horseman, PA-C 10/10/15 1522    Samuel Jester, DO 10/13/15 1234

## 2015-10-10 NOTE — ED Notes (Signed)
Pt has multiple reddened areas noted to left forearm and shoulder. Denies any new soaps, detergents, or other changes, no drainage noted.

## 2015-10-10 NOTE — Discharge Instructions (Signed)
Return for fever, pain, worsening symptoms.  If no better in 5 days, try Lotrimin cream (available over the counter).

## 2015-10-10 NOTE — ED Triage Notes (Signed)
Seen in [facility] on Monday night for hives and told to come back if areas not any better.  Pt says sites are worse areas to left arm are larger and new site on left shoulder.  Rates pain 7/10.

## 2016-06-18 ENCOUNTER — Emergency Department (HOSPITAL_COMMUNITY)
Admission: EM | Admit: 2016-06-18 | Discharge: 2016-06-18 | Disposition: A | Discharge status: Home / Self Care | Payer: Self-pay | Attending: Emergency Medicine | Admitting: Emergency Medicine

## 2016-06-18 ENCOUNTER — Encounter (HOSPITAL_COMMUNITY): Payer: Self-pay | Admitting: Cardiology

## 2016-06-18 DIAGNOSIS — L300 Nummular dermatitis: Secondary | ICD-10-CM | POA: Insufficient documentation

## 2016-06-18 MED ORDER — PREDNISONE 10 MG PO TABS: ORAL_TABLET | ORAL | 0 refills | Status: AC

## 2016-06-18 NOTE — ED Provider Notes (Signed)
[facility] Provider Note   CSN: [id number] Arrival date & time: 06/18/16  1515     History   Chief Complaint Chief Complaint  Patient presents with  . Rash    HPI Ryan Small is a 21 y.o. male presenting with a 2 month history of rash, describing spreading pruritic lesions on his arms and legs, a few also on his abdomen.  He has a history of a similar rash last summer which has resolved after using steroid cream.  He denies pain, drainage from the sites, fevers, chills, body aches, denies joint pain or other complaint. He does have a history of eczema as a child. No other household members with rash, denies new environmental or chemical exposures. He has had no medical treatment for this prior to arrival.   The history is provided by the patient.    Past Medical History:  Diagnosis Date  . Anal fistula     Patient Active Problem List   Diagnosis Date Noted  . Diarrhea 10/31/2011  . Dehydration 10/30/2011  . Hypotension 10/30/2011  . Myalgia 10/30/2011  . Headache(784.0) 10/30/2011    Past Surgical History:  Procedure Laterality Date  . EVALUATION UNDER ANESTHESIA WITH ANAL FISTULECTOMY N/A 10/27/2014   Procedure: EXAM UNDER ANESTHESIA ;  Surgeon: Romie Levee, MD;  Location: [facility] [place];  Service: General;  Laterality: N/A;  . WISDOM TOOTH EXTRACTION         Home Medications    Prior to Admission medications   Medication Sig Start Date End Date Taking? Authorizing Provider  cephALEXin (KEFLEX) 500 MG capsule Take 1 capsule (500 mg total) by mouth 4 (four) times daily. 10/10/15   Roxy Horseman, PA-C  hydrocortisone cream 1 % Apply to affected area 2 times daily 10/08/15   Maia Plan, MD  predniSONE (DELTASONE) 10 MG tablet Take 6 tabs daily by mouth for 2 day,  Then 5 tabs daily for 2 days,  4 tabs daily for 2 days,  3 tabs daily for 2 days,  2 tabs daily for 2 days,  Then 1 tab daily for 2 days. 06/18/16   Burgess Amor, PA-C    Family  History Family History  Problem Relation Age of Onset  . Diabetes Mother     Social History Social History  Substance Use Topics  . Smoking status: Never Smoker  . Smokeless tobacco: Never Used  . Alcohol use Yes     Comment: ocassionally     Allergies   Peanut-containing drug products   Review of Systems Review of Systems  Constitutional: Negative for chills and fever.  Respiratory: Negative for shortness of breath and wheezing.   Skin: Positive for rash.  Neurological: Negative for numbness.     Physical Exam Updated Vital Signs BP (!) 142/92 (BP Location: Left Arm)   Pulse (!) 53   Temp 98.5 F (36.9 C) (Oral)   Resp 16   Ht  (1.803 m)   Wt 101.2 kg   SpO2 100%   BMI 31.10 kg/m   Physical Exam  Constitutional: He appears well-developed and well-nourished. No distress.  HENT:  Head: Normocephalic.  Neck: Neck supple.  Cardiovascular: Normal rate.   Pulmonary/Chest: Effort normal. He has no wheezes.  Musculoskeletal: Normal range of motion. He exhibits no edema.  Skin: Rash noted.  Dime to quarter sized slightly raised lesions on volar surface of forearms, few scattered on abdomen.  Dry, no central clearing.  No surrounding erythema, no pustules or  papules.      ED Treatments / Results  Labs (all labs ordered are listed, but only abnormal results are displayed) Labs Reviewed - No data to display  EKG  EKG Interpretation None       Radiology No results found.  Procedures Procedures (including critical care time)  Medications Ordered in ED Medications - No data to display   Initial Impression / Assessment and Plan / ED Course  I have reviewed the triage vital signs and the nursing notes.  Pertinent labs & imaging results that were available during my care of the patient were reviewed by me and considered in my medical decision making (see chart for details).     No sign of infection, no central clearing so doubt fungal source.  Suspect nummular eczema, especially given similar sx last summer. No vesicles, doubt contact derm.  Pt was placed on prednisone taper, advised f/u with pcp and/or derm (referral given) if sx persist or worsen.  Final Clinical Impressions(s) / ED Diagnoses   Final diagnoses:  Nummular eczema    New Prescriptions Discharge Medication List as of 06/18/2016  4:30 PM    START taking these medications   Details  predniSONE (DELTASONE) 10 MG tablet Take 6 tabs daily by mouth for 2 day,  Then 5 tabs daily for 2 days,  4 tabs daily for 2 days,  3 tabs daily for 2 days,  2 tabs daily for 2 days,  Then 1 tab daily for 2 days., Print         Burgess Amor, PA-C 06/19/16 1236    Marily Memos, MD 06/19/16 [phone]

## 2016-06-18 NOTE — ED Triage Notes (Signed)
Rash off and on times 2 months.  Worse now.

## 2017-02-22 ENCOUNTER — Emergency Department (HOSPITAL_COMMUNITY): Payer: Self-pay

## 2017-02-22 ENCOUNTER — Encounter (HOSPITAL_COMMUNITY): Payer: Self-pay | Admitting: Cardiology

## 2017-02-22 ENCOUNTER — Emergency Department (HOSPITAL_COMMUNITY)
Admission: EM | Admit: 2017-02-22 | Discharge: 2017-02-22 | Disposition: A | Discharge status: Home / Self Care | Payer: Self-pay | Attending: Emergency Medicine | Admitting: Emergency Medicine

## 2017-02-22 DIAGNOSIS — R109 Unspecified abdominal pain: Secondary | ICD-10-CM | POA: Insufficient documentation

## 2017-02-22 LAB — CBC WITH DIFFERENTIAL/PLATELET
Basophils Absolute: 0 10*3/uL (ref 0.0–0.1)
Basophils Relative: 0 %
EOS ABS: 0.1 10*3/uL (ref 0.0–0.7)
Eosinophils Relative: 2 %
HEMATOCRIT: 45.5 % (ref 39.0–52.0)
HEMOGLOBIN: 14.8 g/dL (ref 13.0–17.0)
LYMPHS ABS: 1.8 10*3/uL (ref 0.7–4.0)
Lymphocytes Relative: 38 %
MCH: 27.6 pg (ref 26.0–34.0)
MCHC: 32.5 g/dL (ref 30.0–36.0)
MCV: 84.9 fL (ref 78.0–100.0)
MONOS PCT: 11 %
Monocytes Absolute: 0.5 10*3/uL (ref 0.1–1.0)
NEUTROS PCT: 49 %
Neutro Abs: 2.3 10*3/uL (ref 1.7–7.7)
Platelets: 240 10*3/uL (ref 150–400)
RBC: 5.36 MIL/uL (ref 4.22–5.81)
RDW: 14.2 % (ref 11.5–15.5)
WBC: 4.7 10*3/uL (ref 4.0–10.5)

## 2017-02-22 LAB — URINALYSIS, ROUTINE W REFLEX MICROSCOPIC
BACTERIA UA: NONE SEEN
Bilirubin Urine: NEGATIVE
Glucose, UA: NEGATIVE mg/dL
Hgb urine dipstick: NEGATIVE
Ketones, ur: NEGATIVE mg/dL
Leukocytes, UA: NEGATIVE
Nitrite: NEGATIVE
PROTEIN: 30 mg/dL — AB
Specific Gravity, Urine: 1.034 — ABNORMAL HIGH (ref 1.005–1.030)
Squamous Epithelial / LPF: NONE SEEN
pH: 5 (ref 5.0–8.0)

## 2017-02-22 LAB — COMPREHENSIVE METABOLIC PANEL
ALT: 24 U/L (ref 17–63)
ANION GAP: 8 (ref 5–15)
AST: 27 U/L (ref 15–41)
Albumin: 4.3 g/dL (ref 3.5–5.0)
Alkaline Phosphatase: 97 U/L (ref 38–126)
BILIRUBIN TOTAL: 0.9 mg/dL (ref 0.3–1.2)
BUN: 15 mg/dL (ref 6–20)
CALCIUM: 9.4 mg/dL (ref 8.9–10.3)
CHLORIDE: 103 mmol/L (ref 101–111)
CO2: 23 mmol/L (ref 22–32)
Creatinine, Ser: 1.07 mg/dL (ref 0.61–1.24)
Glucose, Bld: 105 mg/dL — ABNORMAL HIGH (ref 65–99)
POTASSIUM: 3.9 mmol/L (ref 3.5–5.1)
Sodium: 134 mmol/L — ABNORMAL LOW (ref 135–145)
Total Protein: 8 g/dL (ref 6.5–8.1)

## 2017-02-22 LAB — LIPASE, BLOOD: Lipase: 22 U/L (ref 11–51)

## 2017-02-22 MED ORDER — ONDANSETRON 4 MG PO TBDP: 4.0000 mg | ORAL_TABLET | Freq: Three times a day (TID) | ORAL | 0 refills | Status: AC | PRN

## 2017-02-22 MED ORDER — DICYCLOMINE HCL 20 MG PO TABS: 20.0000 mg | ORAL_TABLET | Freq: Four times a day (QID) | ORAL | 0 refills | Status: AC | PRN

## 2017-02-22 MED ORDER — IOPAMIDOL (ISOVUE-300) INJECTION 61%
100.0000 mL | Freq: Once | INTRAVENOUS | Status: AC | PRN
  Administered 2017-02-22: 100 mL via INTRAVENOUS

## 2017-02-22 MED ORDER — ONDANSETRON HCL 4 MG/2ML IJ SOLN
4.0000 mg | INTRAMUSCULAR | Status: DC | PRN
  Administered 2017-02-22: 4 mg via INTRAVENOUS
  Filled 2017-02-22: qty 2

## 2017-02-22 MED ORDER — MORPHINE SULFATE (PF) 4 MG/ML IV SOLN
4.0000 mg | INTRAVENOUS | Status: DC | PRN
  Administered 2017-02-22: 4 mg via INTRAVENOUS
  Filled 2017-02-22: qty 1

## 2017-02-22 NOTE — ED Notes (Signed)
Tolerated PO fluids well.

## 2017-02-22 NOTE — Discharge Instructions (Signed)
Take the prescriptions as directed.  Increase your fluid intake (ie:  Gatoraide) for the next few days.  Eat a bland diet and advance to your regular diet slowly as you can tolerate it.   Call your regular medical doctor Monday to schedule a follow up appointment this week.  Return to the Emergency Department immediately if not improving (or even worsening) despite taking the medicines as prescribed, any black or bloody stool or vomit, if you develop a fever over "101," or for any other concerns.

## 2017-02-22 NOTE — ED Triage Notes (Signed)
C/o lower abdominal pain and nausea  since 2 am.

## 2017-02-22 NOTE — ED Provider Notes (Signed)
[facility] EMERGENCY DEPARTMENT Provider Note   CSN: [id number] Arrival date & time: 02/22/17  4098     History   Chief Complaint Chief Complaint  Patient presents with  . Abdominal Pain    HPI Ryan Small is a 21 y.o. male.   Abdominal Pain      Pt was seen at 0745.  Per pt, c/o gradual onset and persistence of constant generalized abd "pain" since overnight last night.  Has been associated with nausea. Describes the abd pain as "sharp."  Denies vomiting/diarrhea, no fevers, no back pain, no rash, no CP/SOB, no black or blood in stools, no dysuria/hematuria, no testicular pain/swelling.      Past Medical History:  Diagnosis Date  . Anal fistula     Patient Active Problem List   Diagnosis Date Noted  . Diarrhea 10/31/2011  . Dehydration 10/30/2011  . Hypotension 10/30/2011  . Myalgia 10/30/2011  . Headache(784.0) 10/30/2011    Past Surgical History:  Procedure Laterality Date  . EVALUATION UNDER ANESTHESIA WITH ANAL FISTULECTOMY N/A 10/27/2014   Procedure: EXAM UNDER ANESTHESIA ;  Surgeon: Romie Levee, MD;  Location: [facility] [place];  Service: General;  Laterality: N/A;  . WISDOM TOOTH EXTRACTION         Home Medications    Prior to Admission medications   Medication Sig Start Date End Date Taking? Authorizing Provider  cephALEXin (KEFLEX) 500 MG capsule Take 1 capsule (500 mg total) by mouth 4 (four) times daily. 10/10/15   Roxy Horseman, PA-C  hydrocortisone cream 1 % Apply to affected area 2 times daily 10/08/15   Long, Arlyss Repress, MD  predniSONE (DELTASONE) 10 MG tablet Take 6 tabs daily by mouth for 2 day,  Then 5 tabs daily for 2 days,  4 tabs daily for 2 days,  3 tabs daily for 2 days,  2 tabs daily for 2 days,  Then 1 tab daily for 2 days. 06/18/16   Burgess Amor, PA-C    Family History Family History  Problem Relation Age of Onset  . Diabetes Mother     Social History Social History   Tobacco Use  . Smoking status: Never  Smoker  . Smokeless tobacco: Never Used  Substance Use Topics  . Alcohol use: Yes    Comment: ocassionally  . Drug use: No     Allergies   Peanut-containing drug products   Review of Systems Review of Systems  Gastrointestinal: Positive for abdominal pain.  ROS: Statement: All systems negative except as marked or noted in the HPI; Constitutional: Negative for fever and chills. ; ; Eyes: Negative for eye pain, redness and discharge. ; ; ENMT: Negative for ear pain, hoarseness, nasal congestion, sinus pressure and sore throat. ; ; Cardiovascular: Negative for chest pain, palpitations, diaphoresis, dyspnea and peripheral edema. ; ; Respiratory: Negative for cough, wheezing and stridor. ; ; Gastrointestinal: +nausea, abd pain. Negative for vomiting, diarrhea, blood in stool, hematemesis, jaundice and rectal bleeding. . ; ; Genitourinary: Negative for dysuria, flank pain and hematuria. ; ; Genital:  No penile drainage or rash, no testicular pain or swelling, no scrotal rash or swelling. ;; Musculoskeletal: Negative for back pain and neck pain. Negative for swelling and trauma.; ; Skin: Negative for pruritus, rash, abrasions, blisters, bruising and skin lesion.; ; Neuro: Negative for headache, lightheadedness and neck stiffness. Negative for weakness, altered level of consciousness, altered mental status, extremity weakness, paresthesias, involuntary movement, seizure and syncope.  Physical Exam Updated Vital Signs BP (!) 135/93   Pulse 90   Temp 98.3 F (36.8 C) (Oral)   Resp 18   Ht 5\' 11"  (1.803 m)   Wt 108.9 kg (240 lb)   SpO2 97%   BMI 33.47 kg/m   Physical Exam 0750: Physical examination:  Nursing notes reviewed; Vital signs and O2 SAT reviewed;  Constitutional: Well developed, Well nourished, Well hydrated, Uncomfortable appearing.; Head:  Normocephalic, atraumatic; Eyes: EOMI, PERRL, No scleral icterus; ENMT: Mouth and pharynx normal, Mucous membranes moist; Neck: Supple,  Full range of motion, No lymphadenopathy; Cardiovascular: Regular rate and rhythm, No gallop; Respiratory: Breath sounds clear & equal bilaterally, No wheezes.  Speaking full sentences with ease, Normal respiratory effort/excursion; Chest: Nontender, Movement normal; Abdomen: Soft, +diffuse tenderness to palp. Nondistended, Normal bowel sounds; Genitourinary: No CVA tenderness; Extremities: Pulses normal, No tenderness, No edema, No calf edema or asymmetry.; Neuro: AA&Ox3, Major CN grossly intact.  Speech clear. No gross focal motor or sensory deficits in extremities.; Skin: Color normal, Warm, Dry.   ED Treatments / Results  Labs (all labs ordered are listed, but only abnormal results are displayed)   EKG  EKG Interpretation None       Radiology   Procedures Procedures (including critical care time)  Medications Ordered in ED Medications  morphine 4 MG/ML injection 4 mg (not administered)  ondansetron (ZOFRAN) injection 4 mg (not administered)     Initial Impression / Assessment and Plan / ED Course  I have reviewed the triage vital signs and the nursing notes.  Pertinent labs & imaging results that were available during my care of the patient were reviewed by me and considered in my medical decision making (see chart for details).  MDM Reviewed: previous chart, nursing note and vitals Reviewed previous: labs Interpretation: labs and CT scan    Results for orders placed or performed during the hospital encounter of 02/22/17  Comprehensive metabolic panel  Result Value Ref Range   Sodium 134 (L) 135 - 145 mmol/L   Potassium 3.9 3.5 - 5.1 mmol/L   Chloride 103 101 - 111 mmol/L   CO2 23 22 - 32 mmol/L   Glucose, Bld 105 (H) 65 - 99 mg/dL   BUN 15 6 - 20 mg/dL   Creatinine, Ser 1.611.07 0.61 - 1.24 mg/dL   Calcium 9.4 8.9 - 09.610.3 mg/dL   Total Protein 8.0 6.5 - 8.1 g/dL   Albumin 4.3 3.5 - 5.0 g/dL   AST 27 15 - 41 U/L   ALT 24 17 - 63 U/L   Alkaline Phosphatase 97 38 -  126 U/L   Total Bilirubin 0.9 0.3 - 1.2 mg/dL   GFR calc non Af Amer >60 >60 mL/min   GFR calc Af Amer >60 >60 mL/min   Anion gap 8 5 - 15  Lipase, blood  Result Value Ref Range   Lipase 22 11 - 51 U/L  CBC with Differential  Result Value Ref Range   WBC 4.7 4.0 - 10.5 K/uL   RBC 5.36 4.22 - 5.81 MIL/uL   Hemoglobin 14.8 13.0 - 17.0 g/dL   HCT 04.545.5 40.939.0 - 81.152.0 %   MCV 84.9 78.0 - 100.0 fL   MCH 27.6 26.0 - 34.0 pg   MCHC 32.5 30.0 - 36.0 g/dL   RDW 91.414.2 78.211.5 - 95.615.5 %   Platelets 240 150 - 400 K/uL   Neutrophils Relative % 49 %   Neutro Abs 2.3 1.7 - 7.7 K/uL  Lymphocytes Relative 38 %   Lymphs Abs 1.8 0.7 - 4.0 K/uL   Monocytes Relative 11 %   Monocytes Absolute 0.5 0.1 - 1.0 K/uL   Eosinophils Relative 2 %   Eosinophils Absolute 0.1 0.0 - 0.7 K/uL   Basophils Relative 0 %   Basophils Absolute 0.0 0.0 - 0.1 K/uL  Urinalysis, Routine w reflex microscopic  Result Value Ref Range   Color, Urine YELLOW YELLOW   APPearance CLEAR CLEAR   Specific Gravity, Urine 1.034 (H) 1.005 - 1.030   pH 5.0 5.0 - 8.0   Glucose, UA NEGATIVE NEGATIVE mg/dL   Hgb urine dipstick NEGATIVE NEGATIVE   Bilirubin Urine NEGATIVE NEGATIVE   Ketones, ur NEGATIVE NEGATIVE mg/dL   Protein, ur 30 (A) NEGATIVE mg/dL   Nitrite NEGATIVE NEGATIVE   Leukocytes, UA NEGATIVE NEGATIVE   RBC / HPF 0-5 0 - 5 RBC/hpf   WBC, UA 0-5 0 - 5 WBC/hpf   Bacteria, UA NONE SEEN NONE SEEN   Squamous Epithelial / LPF NONE SEEN NONE SEEN   Mucus PRESENT    Ct Abdomen Pelvis W Contrast Result Date: 02/22/2017 CLINICAL DATA:  Lower abdominal pain, nausea since 2 am. Hx Of anal fistula EXAM: CT ABDOMEN AND PELVIS WITH CONTRAST TECHNIQUE: Multidetector CT imaging of the abdomen and pelvis was performed using the standard protocol following bolus administration of intravenous contrast. CONTRAST:  100mL ISOVUE-300 IOPAMIDOL (ISOVUE-300) INJECTION 61% COMPARISON:  04/26/2013 FINDINGS: Lower chest: Clear lung bases.  Heart normal  size. Hepatobiliary: No focal liver abnormality is seen. No gallstones, gallbladder wall thickening, or biliary dilatation. Pancreas: Unremarkable. No pancreatic ductal dilatation or surrounding inflammatory changes. Spleen: Normal in size without focal abnormality. Adrenals/Urinary Tract: Adrenal glands are unremarkable. Kidneys are normal, without renal calculi, focal lesion, or hydronephrosis. Bladder is unremarkable. Stomach/Bowel: Stomach is within normal limits. Appendix appears normal. No evidence of bowel wall thickening, distention, or inflammatory changes. Vascular/Lymphatic: No significant vascular findings are present. No enlarged abdominal or pelvic lymph nodes. Reproductive: Unremarkable. Other: No abdominal wall hernia or abnormality. No abdominopelvic ascites. Musculoskeletal: Chronic bilateral pars defects at L5-S1 with a grade 1 anterolisthesis. No other skeletal abnormality. IMPRESSION: 1. No acute findings within the abdomen or pelvis. No findings to account for the patient's symptoms. 2. Chronic bilateral pars defects at L5-S1 with a grade 1 anterolisthesis. No other abnormalities. Electronically Signed   By: Amie Portlandavid  Ormond M.D.   On: 02/22/2017 09:08    0915:  Pt has tol PO well while in the ED without N/V.  No stooling while in the ED.  Abd benign, VSS. Feels better and wants to go home now. Tx symptomatically at this time. Dx and testing d/w pt.  Questions answered.  Verb understanding, agreeable to d/c home with outpt f/u.    Final Clinical Impressions(s) / ED Diagnoses   Final diagnoses:  None    ED Discharge Orders    None        Samuel JesterMcManus, Bayli Quesinberry, DO 02/25/17 2000

## 2017-02-23 ENCOUNTER — Emergency Department (HOSPITAL_COMMUNITY): Payer: Self-pay

## 2017-02-23 ENCOUNTER — Other Ambulatory Visit: Payer: Self-pay

## 2017-02-23 ENCOUNTER — Encounter (HOSPITAL_COMMUNITY): Payer: Self-pay | Admitting: Emergency Medicine

## 2017-02-23 ENCOUNTER — Emergency Department (HOSPITAL_COMMUNITY)
Admission: EM | Admit: 2017-02-23 | Discharge: 2017-02-23 | Disposition: A | Discharge status: Home / Self Care | Payer: Self-pay | Attending: Emergency Medicine | Admitting: Emergency Medicine

## 2017-02-23 DIAGNOSIS — R1084 Generalized abdominal pain: Secondary | ICD-10-CM | POA: Insufficient documentation

## 2017-02-23 DIAGNOSIS — K5900 Constipation, unspecified: Secondary | ICD-10-CM | POA: Insufficient documentation

## 2017-02-23 DIAGNOSIS — Z79899 Other long term (current) drug therapy: Secondary | ICD-10-CM | POA: Insufficient documentation

## 2017-02-23 LAB — CBC WITH DIFFERENTIAL/PLATELET
BASOS ABS: 0 10*3/uL (ref 0.0–0.1)
BASOS PCT: 0 %
EOS ABS: 0.1 10*3/uL (ref 0.0–0.7)
EOS PCT: 2 %
HEMATOCRIT: 46.3 % (ref 39.0–52.0)
Hemoglobin: 15.1 g/dL (ref 13.0–17.0)
Lymphocytes Relative: 26 %
Lymphs Abs: 1.3 10*3/uL (ref 0.7–4.0)
MCH: 27.8 pg (ref 26.0–34.0)
MCHC: 32.6 g/dL (ref 30.0–36.0)
MCV: 85.3 fL (ref 78.0–100.0)
MONO ABS: 0.5 10*3/uL (ref 0.1–1.0)
Monocytes Relative: 10 %
Neutro Abs: 3.1 10*3/uL (ref 1.7–7.7)
Neutrophils Relative %: 62 %
PLATELETS: 245 10*3/uL (ref 150–400)
RBC: 5.43 MIL/uL (ref 4.22–5.81)
RDW: 14.2 % (ref 11.5–15.5)
WBC: 5.1 10*3/uL (ref 4.0–10.5)

## 2017-02-23 LAB — COMPREHENSIVE METABOLIC PANEL
ALBUMIN: 4.5 g/dL (ref 3.5–5.0)
ALK PHOS: 102 U/L (ref 38–126)
ALT: 27 U/L (ref 17–63)
AST: 28 U/L (ref 15–41)
Anion gap: 9 (ref 5–15)
BILIRUBIN TOTAL: 1 mg/dL (ref 0.3–1.2)
BUN: 13 mg/dL (ref 6–20)
CALCIUM: 9.6 mg/dL (ref 8.9–10.3)
CO2: 27 mmol/L (ref 22–32)
CREATININE: 1.23 mg/dL (ref 0.61–1.24)
Chloride: 102 mmol/L (ref 101–111)
GFR calc Af Amer: >60 mL/min (ref >60)
GFR calc non Af Amer: >60 mL/min (ref >60)
GLUCOSE: 99 mg/dL (ref 65–99)
Potassium: 3.8 mmol/L (ref 3.5–5.1)
Sodium: 138 mmol/L (ref 135–145)
TOTAL PROTEIN: 8.5 g/dL — AB (ref 6.5–8.1)

## 2017-02-23 MED ORDER — SODIUM CHLORIDE 0.9 % IV BOLUS (SEPSIS)
1000.0000 mL | Freq: Once | INTRAVENOUS | Status: AC
  Administered 2017-02-23: 1000 mL via INTRAVENOUS

## 2017-02-23 MED ORDER — IOPAMIDOL (ISOVUE-300) INJECTION 61%
100.0000 mL | Freq: Once | INTRAVENOUS | Status: AC | PRN
  Administered 2017-02-23: 100 mL via INTRAVENOUS

## 2017-02-23 MED ORDER — ONDANSETRON HCL 4 MG PO TABS: 4.0000 mg | ORAL_TABLET | Freq: Three times a day (TID) | ORAL | 0 refills | Status: AC | PRN

## 2017-02-23 MED ORDER — ONDANSETRON HCL 4 MG/2ML IJ SOLN
4.0000 mg | Freq: Once | INTRAMUSCULAR | Status: AC
  Administered 2017-02-23: 4 mg via INTRAVENOUS
  Filled 2017-02-23: qty 2

## 2017-02-23 MED ORDER — FENTANYL CITRATE (PF) 100 MCG/2ML IJ SOLN
50.0000 ug | Freq: Once | INTRAMUSCULAR | Status: AC
  Administered 2017-02-23: 50 ug via INTRAVENOUS
  Filled 2017-02-23: qty 2

## 2017-02-23 MED ORDER — PEG 3350-KCL-NA BICARB-NACL 420 G PO SOLR
4000.0000 mL | Freq: Once | ORAL | Status: AC
  Administered 2017-02-23: 4000 mL via ORAL
  Filled 2017-02-23: qty 4000

## 2017-02-23 MED ORDER — BISACODYL 10 MG RE SUPP
10.0000 mg | Freq: Once | RECTAL | Status: AC
  Administered 2017-02-23: 10 mg via RECTAL
  Filled 2017-02-23: qty 1

## 2017-02-23 MED ORDER — FLEET ENEMA 7-19 GM/118ML RE ENEM
1.0000 | ENEMA | Freq: Once | RECTAL | Status: AC
  Administered 2017-02-23: 1 via RECTAL

## 2017-02-23 NOTE — ED Notes (Signed)
Marchelle FolksAmanda, NT came to this RN stating that this pt was sitting on the floor with his head over the trash can and the pt told her that he was weak. This RN went into the pt's room and the pt had vomited in the trash x 2. Pt states, "I thought this was supposed to make me poop?" EDP aware that the pt vomited x 2. EDP is to put orders in for zofran. Pt has only drank 2 cups of the NuLYTELY.

## 2017-02-23 NOTE — ED Notes (Signed)
This RN went into pt's room. Pt c/o abdominal pain and states he drank half a cup of the NuLYTELY and vomited again.

## 2017-02-23 NOTE — ED Notes (Signed)
Pt sleeping. 

## 2017-02-23 NOTE — ED Provider Notes (Signed)
[facility] EMERGENCY DEPARTMENT Provider Note   CSN: [id number] Arrival date & time: 02/23/17  0019  Time seen 12:27 AM   History   Chief Complaint Chief Complaint  Patient presents with  . Abdominal Pain    HPI Ryan Small is a 21 y.o. male.  HPI patient states he was awakened from sleep at 4 AM on December 9.  He complained of lower abdominal pain which now has become diffuse.  He states the pain is constant however it waxes and wanes.  He states that sharp and stabbing.  He has had some nausea without vomiting or diarrhea.  He states his last BM was December 7 and he had to force it and it was " a piece".  He states since then he feels like he needs to have a bowel movement but cannot, he states he does not want to strain because "I do not want to kill myself".  He states nothing he does makes the pain worse, nothing he does makes it feel better.  He states when he stands up he can feel a weight in his abdomen and he feels like he can feel all the food he has eaten shifting around.  He complains of lots of belching but denies fever.  He denies any urinary difficulty.  He states his never had this pain before.  He denies any rectal pain and does not feel like he has stool stuck in his rectum.  He was seen in the ED in the morning on December 9 and had a CT scan done which did not show any acute findings and lab work done which were basically normal.  He was discharged home with nausea medication and pain medication.  PCP none  Past Medical History:  Diagnosis Date  . Anal fistula     Patient Active Problem List   Diagnosis Date Noted  . Diarrhea 10/31/2011  . Dehydration 10/30/2011  . Hypotension 10/30/2011  . Myalgia 10/30/2011  . Headache(784.0) 10/30/2011    Past Surgical History:  Procedure Laterality Date  . EVALUATION UNDER ANESTHESIA WITH ANAL FISTULECTOMY N/A 10/27/2014   Procedure: EXAM UNDER ANESTHESIA ;  Surgeon: Romie LeveeAlicia Thomas, MD;  [facility];  Service: General;  Laterality: N/A;  . WISDOM TOOTH EXTRACTION         Home Medications    Prior to Admission medications   Medication Sig Start Date End Date Taking? Authorizing Provider  cephALEXin (KEFLEX) 500 MG capsule Take 1 capsule (500 mg total) by mouth 4 (four) times daily. 10/10/15   Roxy HorsemanBrowning, Robert, PA-C  dicyclomine (BENTYL) 20 MG tablet Take 1 tablet (20 mg total) by mouth every 6 (six) hours as needed for spasms (abdominal cramping). 02/22/17   Samuel JesterMcManus, Kathleen, DO  hydrocortisone cream 1 % Apply to affected area 2 times daily 10/08/15   Long, Arlyss RepressJoshua G, MD  ondansetron (ZOFRAN ODT) 4 MG disintegrating tablet Take 1 tablet (4 mg total) by mouth every 8 (eight) hours as needed for nausea or vomiting. 02/22/17   Samuel JesterMcManus, Kathleen, DO  ondansetron (ZOFRAN) 4 MG tablet Take 1 tablet (4 mg total) by mouth every 8 (eight) hours as needed for nausea or vomiting. 02/23/17   Devoria AlbeKnapp, Kenson Groh, MD  predniSONE (DELTASONE) 10 MG tablet Take 6 tabs daily by mouth for 2 day,  Then 5 tabs daily for 2 days,  4 tabs daily for 2 days,  3 tabs daily for 2 days,  2 tabs daily for 2 days,  Then 1 tab daily for 2 days. 06/18/16   Burgess Amor, PA-C    Family History Family History  Problem Relation Age of Onset  . Diabetes Mother     Social History Social History   Tobacco Use  . Smoking status: Never Smoker  . Smokeless tobacco: Never Used  Substance Use Topics  . Alcohol use: Yes    Comment: ocassionally  . Drug use: No  employed   Allergies   Peanut-containing drug products   Review of Systems Review of Systems  All other systems reviewed and are negative.    Physical Exam Updated Vital Signs BP 130/86 (BP Location: Left Arm)   Pulse (!) 104   Temp 98.3 F (36.8 C) (Oral)   Resp 20   Ht 5\' 11"  (1.803 m) Comment: Simultaneous filing. User may not have seen previous data.  Wt 108.9 kg (240 lb) Comment: Simultaneous filing. User may not have seen previous data.   SpO2 96%   BMI 33.47 kg/m   Vital signs normal except for tachycardia    Physical Exam  Constitutional: He is oriented to person, place, and time. He appears well-developed and well-nourished.  Non-toxic appearance. He does not appear ill. No distress.  Appears uncomfortable, every once in a while he grabs his abdomen.  HENT:  Head: Normocephalic and atraumatic.  Right Ear: External ear normal.  Left Ear: External ear normal.  Nose: Nose normal. No mucosal edema or rhinorrhea.  Mouth/Throat: Oropharynx is clear and moist and mucous membranes are normal. No dental abscesses or uvula swelling.  Eyes: Conjunctivae and EOM are normal. Pupils are equal, round, and reactive to light.  Neck: Normal range of motion and full passive range of motion without pain. Neck supple.  Cardiovascular: Normal rate, regular rhythm and normal heart sounds. Exam reveals no gallop and no friction rub.  No murmur heard. Pulmonary/Chest: Effort normal and breath sounds normal. No respiratory distress. He has no wheezes. He has no rhonchi. He has no rales. He exhibits no tenderness and no crepitus.  Abdominal: Soft. Normal appearance and bowel sounds are normal. He exhibits no distension. There is generalized tenderness. There is no rebound and no guarding.  Musculoskeletal: Normal range of motion. He exhibits no edema or tenderness.  Moves all extremities well.   Neurological: He is alert and oriented to person, place, and time. He has normal strength. No cranial nerve deficit.  Skin: Skin is warm, dry and intact. No rash noted. No erythema. No pallor.  Psychiatric: He has a normal mood and affect. His speech is normal and behavior is normal. His mood appears not anxious.  Nursing note and vitals reviewed.    ED Treatments / Results  Labs (all labs ordered are listed, but only abnormal results are displayed) Results for orders placed or performed during the hospital encounter of 02/23/17  Comprehensive  metabolic panel  Result Value Ref Range   Sodium 138 135 - 145 mmol/L   Potassium 3.8 3.5 - 5.1 mmol/L   Chloride 102 101 - 111 mmol/L   CO2 27 22 - 32 mmol/L   Glucose, Bld 99 65 - 99 mg/dL   BUN 13 6 - 20 mg/dL   Creatinine, Ser 4.09 0.61 - 1.24 mg/dL   Calcium 9.6 8.9 - 81.1 mg/dL   Total Protein 8.5 (H) 6.5 - 8.1 g/dL   Albumin 4.5 3.5 - 5.0 g/dL   AST 28 15 - 41 U/L   ALT 27 17 - 63 U/L  Alkaline Phosphatase 102 38 - 126 U/L   Total Bilirubin 1.0 0.3 - 1.2 mg/dL   GFR calc non Af Amer >60 >60 mL/min   GFR calc Af Amer >60 >60 mL/min   Anion gap 9 5 - 15  CBC with Differential  Result Value Ref Range   WBC 5.1 4.0 - 10.5 K/uL   RBC 5.43 4.22 - 5.81 MIL/uL   Hemoglobin 15.1 13.0 - 17.0 g/dL   HCT 16.146.3 09.639.0 - 04.552.0 %   MCV 85.3 78.0 - 100.0 fL   MCH 27.8 26.0 - 34.0 pg   MCHC 32.6 30.0 - 36.0 g/dL   RDW 40.914.2 81.111.5 - 91.415.5 %   Platelets 245 150 - 400 K/uL   Neutrophils Relative % 62 %   Neutro Abs 3.1 1.7 - 7.7 K/uL   Lymphocytes Relative 26 %   Lymphs Abs 1.3 0.7 - 4.0 K/uL   Monocytes Relative 10 %   Monocytes Absolute 0.5 0.1 - 1.0 K/uL   Eosinophils Relative 2 %   Eosinophils Absolute 0.1 0.0 - 0.7 K/uL   Basophils Relative 0 %   Basophils Absolute 0.0 0.0 - 0.1 K/uL   Laboratory interpretation all normal     EKG  EKG Interpretation None       Radiology  Ct Abdomen Pelvis W Contrast  Result Date: 02/23/2017 CLINICAL DATA:  Acute onset of generalized abdominal pain. EXAM: CT ABDOMEN AND PELVIS WITH CONTRAST TECHNIQUE: Multidetector CT imaging of the abdomen and pelvis was performed using the standard protocol following bolus administration of intravenous contrast. CONTRAST:  100mL ISOVUE-300 IOPAMIDOL (ISOVUE-300) INJECTION 61% COMPARISON:  CT of the abdomen and pelvis performed 02/22/2017 FINDINGS: Lower chest: The visualized lung bases are grossly clear. The visualized portions of the mediastinum are unremarkable. Hepatobiliary: The liver is unremarkable  in appearance. The gallbladder is unremarkable in appearance. The common bile duct remains normal in caliber. Pancreas: The pancreas is within normal limits. Spleen: The spleen is unremarkable in appearance. Adrenals/Urinary Tract: The adrenal glands are unremarkable in appearance. The kidneys are within normal limits. There is no evidence of hydronephrosis. No renal or ureteral stones are identified. No perinephric stranding is seen. Stomach/Bowel: The stomach is grossly unremarkable in appearance. The small bowel is within normal limits. The appendix is normal in caliber, without evidence of appendicitis. The colon is unremarkable in appearance. Vascular/Lymphatic: The abdominal aorta is unremarkable in appearance. The inferior vena cava is grossly unremarkable. No retroperitoneal lymphadenopathy is seen. No pelvic sidewall lymphadenopathy is identified. Reproductive: The bladder is mildly distended and grossly unremarkable. The prostate remains normal in size. Other: No additional soft tissue abnormalities are seen. Musculoskeletal: No acute osseous abnormalities are identified. Chronic bilateral pars defects are seen at L5, with mild grade 1 anterolisthesis of L5 on S1. The visualized musculature is unremarkable in appearance. IMPRESSION: 1. No acute abnormality seen to explain the patient's symptoms. 2. 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 Electronically Signed   By: Roanna RaiderJeffery  Chang M.D.   On: 02/23/2017 02:45    Dg Abd 2 Views  Result Date: 02/23/2017 CLINICAL DATA:  21 y/o  M; abdominal pain and nausea. EXAM: ABDOMEN - 2 VIEW COMPARISON:  02/22/2017 CT abdomen and pelvis. FINDINGS: Mildly dilated loops of small bowel with fluid levels may represent bowel obstruction or focal ileus. No pneumoperitoneum identified. Bones are unremarkable. IMPRESSION: Mildly dilated loops of small bowel with fluid levels may represent obstruction or ileus. Electronically  Signed   By: Mitzi Hansen M.D.   On: 02/23/2017 01:19    Ct Abdomen Pelvis W Contrast  Result Date: 02/22/2017 CLINICAL DATA:  Lower abdominal pain, nausea since 2 am. Hx Of anal fistula . IMPRESSION: 1. No acute findings within the abdomen or pelvis. No findings to account for the patient's symptoms. 2. Chronic bilateral pars defects at L5-S1 with a grade 1 anterolisthesis. No other abnormalities. Electronically Signed   By: Amie Portland M.D.   On: 02/22/2017 09:08    Procedures Procedures (including critical care time)  Medications Ordered in ED Medications  sodium chloride 0.9 % bolus 1,000 mL (0 mLs Intravenous Stopped 02/23/17 0328)  sodium chloride 0.9 % bolus 1,000 mL (1,000 mLs Intravenous New Bag/Given 02/23/17 0327)  fentaNYL (SUBLIMAZE) injection 50 mcg (50 mcg Intravenous Given 02/23/17 0157)  ondansetron (ZOFRAN) injection 4 mg (4 mg Intravenous Given 02/23/17 0158)  iopamidol (ISOVUE-300) 61 % injection 100 mL (100 mLs Intravenous Contrast Given 02/23/17 0209)  polyethylene glycol-electrolytes (NuLYTELY/GoLYTELY) solution 4,000 mL (4,000 mLs Oral Given 02/23/17 0328)  sodium phosphate (FLEET) 7-19 GM/118ML enema 1 enema (1 enema Rectal Given 02/23/17 0512)  ondansetron (ZOFRAN) injection 4 mg (4 mg Intravenous Given 02/23/17 0510)  bisacodyl (DULCOLAX) suppository 10 mg (10 mg Rectal Given 02/23/17 0620)     Initial Impression / Assessment and Plan / ED Course  I have reviewed the triage vital signs and the nursing notes.  Pertinent labs & imaging results that were available during my care of the patient were reviewed by me and considered in my medical decision making (see chart for details).     Patient symptoms sound like constipation.  I reviewed his CT from yesterday and he does have a lot of stool in the lower colon.  Abdominal x-rays were ordered tonight.  1:35 AM patient has been in the bathroom, when he came out I discussed his x-ray results.  IV  fluids, IV pain and nausea medicine were ordered.  CT scan was done with contrast to try to clarify if he has a bowel obstruction or if he is just severely constipated.  Patient states "can't you just give me a laxative?".  The a.m. patient was sleeping.  I woke him up to give him results.  He was given a container for Nulytely to start taking in the ED.  Recheck 4 AM patient is trying to drink the Nulytely.  He states his pain is starting to return.  I explained to him that he could get any more pain medication because that will make his constipation worse.  He had told the nurse he wanted to leave the ED to he had a bowel movement.  I told him he needed to drink the fluid so he can have a bowel movement.  Patient had some vomiting after trying to drink the [facility], he did not drink very much at all.  He was given a fleets enema and did not retain it very long and had a small amount of stool come out.  He states he feels like he still very full.  He was then given a Dulcolax suppository.  06:30 AM he is on the bedside toilet, states he is having small results. We discussed he may need to continue laxatives at home.   7 AM patient is curled up in his bed.  He states he had minimal results.  We discussed at this time I had done more than we normally do to help somewhat he is  constipated in the emergency department and the rest will be in the need to be done at home.  Final Clinical Impressions(s) / ED Diagnoses   Final diagnoses:  Generalized abdominal pain  Constipation, unspecified constipation type    ED Discharge Orders        Ordered    ondansetron (ZOFRAN) 4 MG tablet  Every 8 hours PRN     02/23/17 0654    OTC miralax, dulcolax, fleets enema  Plan discharge  Devoria Albe, MD, Concha Pyo, MD 02/23/17 [phone]

## 2017-02-23 NOTE — ED Triage Notes (Signed)
Pt states he was here yesterday morning for abdominal pain. Pt states the pain hasnt went away and that "it feels like a lot of weight on my stomach" states he has taken the medications as prescribed but nothing has helped.

## 2017-02-23 NOTE — ED Notes (Signed)
Pt called for an RN, this RN went into pt's room and pt states he was only able to hold fluids in rectum for about 1 min. EDP aware and orders for a suppository were received. Pt continues to c/o abdominal pain.

## 2017-02-23 NOTE — ED Notes (Signed)
Pt drinking NuLYTELY at this time. Pt requesting more pain meds. EDP aware and no new orders received.

## 2017-02-23 NOTE — ED Notes (Signed)
Fleets enema administered and bedside commode placed beside pt's bed. Pt was given instructions on how the enema worked.

## 2017-02-23 NOTE — Discharge Instructions (Signed)
Get miralax and put one dose or 17 g in 8 ounces of water or drink of your choice,  take 1 dose every 30 minutes for 2-3 hours or until you  get good results and then once or twice daily to prevent constipation. You could also try 1 bottle of magnesium citrate or you can also try a fleets enema at home and try dulcolax suppositories or orally. Use the zofran for nausea or vomiting.

## 2017-02-23 NOTE — ED Notes (Signed)
EDP in to see pt. EDP made pt aware of pain meds causing constipation.

## 2017-03-26 DIAGNOSIS — H5213 Myopia, bilateral: Secondary | ICD-10-CM | POA: Diagnosis not present

## 2017-03-26 DIAGNOSIS — H52223 Regular astigmatism, bilateral: Secondary | ICD-10-CM | POA: Diagnosis not present

## 2017-05-29 DIAGNOSIS — S8001XA Contusion of right knee, initial encounter: Secondary | ICD-10-CM | POA: Diagnosis not present

## 2017-09-15 DIAGNOSIS — R358 Other polyuria: Secondary | ICD-10-CM | POA: Diagnosis not present

## 2017-09-15 DIAGNOSIS — R350 Frequency of micturition: Secondary | ICD-10-CM | POA: Diagnosis not present

## 2017-09-15 DIAGNOSIS — R635 Abnormal weight gain: Secondary | ICD-10-CM | POA: Diagnosis not present

## 2017-09-15 DIAGNOSIS — E559 Vitamin D deficiency, unspecified: Secondary | ICD-10-CM | POA: Diagnosis not present

## 2017-09-15 DIAGNOSIS — Z13220 Encounter for screening for lipoid disorders: Secondary | ICD-10-CM | POA: Diagnosis not present

## 2017-09-15 DIAGNOSIS — Z833 Family history of diabetes mellitus: Secondary | ICD-10-CM | POA: Diagnosis not present

## 2017-09-15 DIAGNOSIS — R631 Polydipsia: Secondary | ICD-10-CM | POA: Diagnosis not present

## 2017-09-15 DIAGNOSIS — E669 Obesity, unspecified: Secondary | ICD-10-CM | POA: Diagnosis not present

## 2017-09-22 DIAGNOSIS — N529 Male erectile dysfunction, unspecified: Secondary | ICD-10-CM | POA: Diagnosis not present

## 2017-09-22 DIAGNOSIS — R195 Other fecal abnormalities: Secondary | ICD-10-CM | POA: Diagnosis not present

## 2017-09-22 DIAGNOSIS — N3000 Acute cystitis without hematuria: Secondary | ICD-10-CM | POA: Diagnosis not present

## 2017-11-14 DIAGNOSIS — R197 Diarrhea, unspecified: Secondary | ICD-10-CM | POA: Diagnosis not present

## 2017-11-14 DIAGNOSIS — R110 Nausea: Secondary | ICD-10-CM | POA: Diagnosis not present

## 2019-10-17 IMAGING — CT CT ABD-PELV W/ CM
2 of 4 series · 16 of 46 positions shown, 18 images · IV contrast (Isovue)
Comparison: 04/26/2013

CLINICAL DATA: Lower abdominal pain, nausea since 2 am. Hx Of anal
fistula

EXAM:
CT ABDOMEN AND PELVIS WITH CONTRAST
TECHNIQUE: Multidetector CT imaging of the abdomen and pelvis was performed
using the standard protocol following bolus administration of
intravenous contrast.
CONTRAST:  100mL VCY87U-OAA IOPAMIDOL (VCY87U-OAA) INJECTION 61%

[Series 2: axial st · axial · 0.85mm/px · z∈[+902,+1367]mm · 13 of 103 slices shown, 15 images]
[im 5/103  soft-tissue]
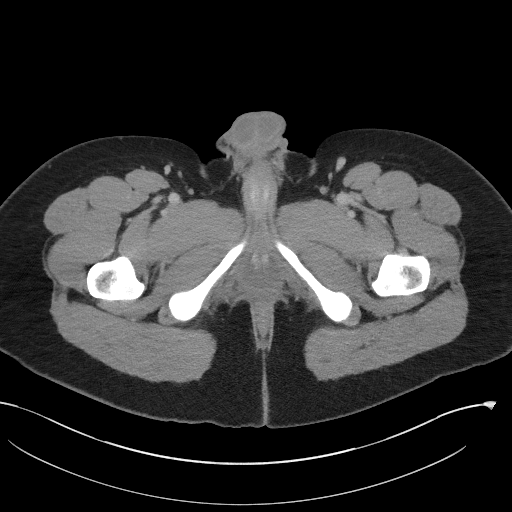
[im 5/103  bone]
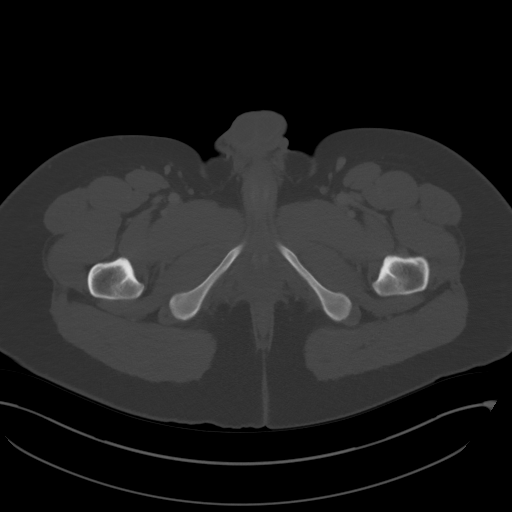
[im 13/103  soft-tissue]
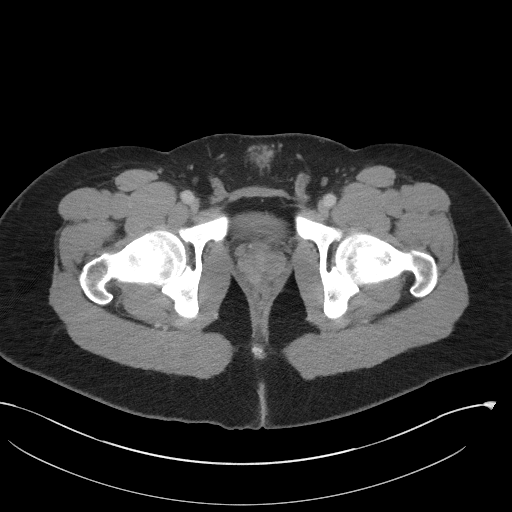
[im 21/103  soft-tissue]
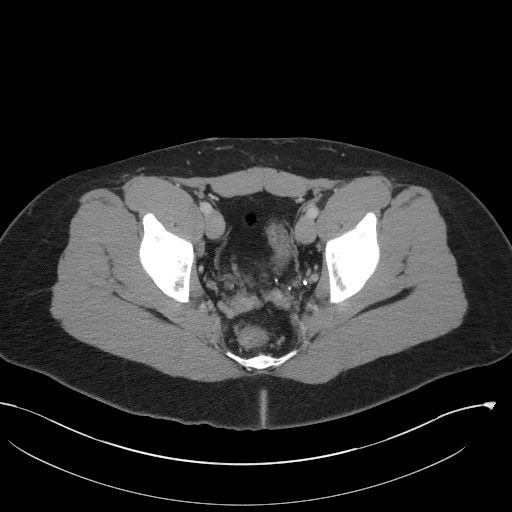
[im 29/103  soft-tissue]
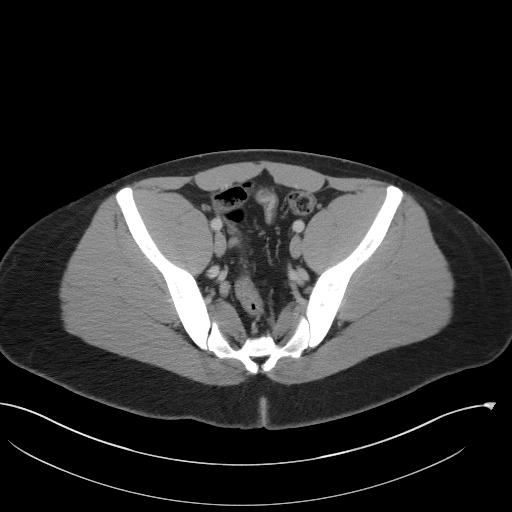
[im 37/103  soft-tissue]
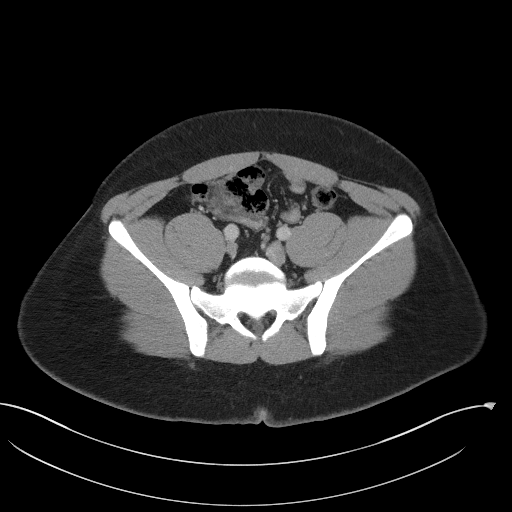
[im 45/103  soft-tissue]
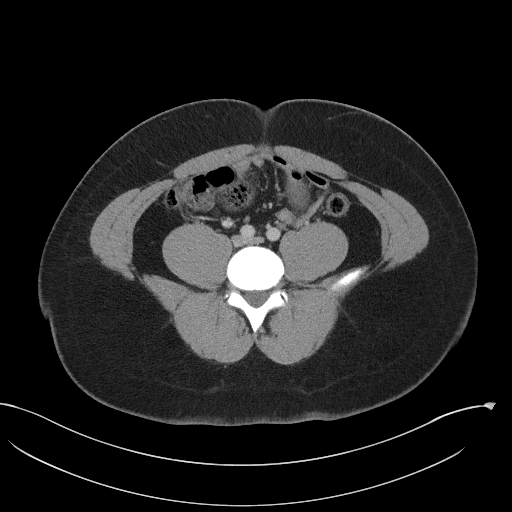
[im 54/103  soft-tissue]
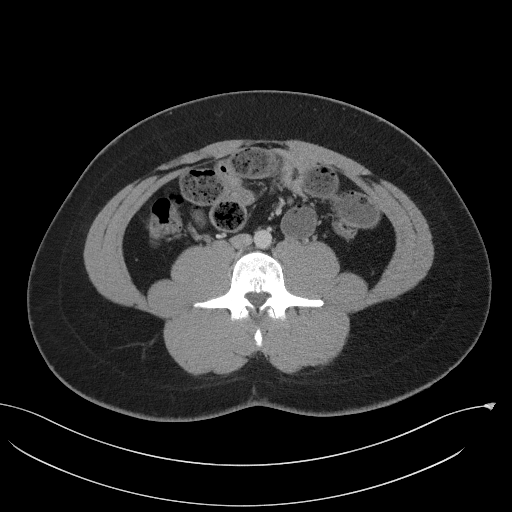
[im 58/103  soft-tissue]
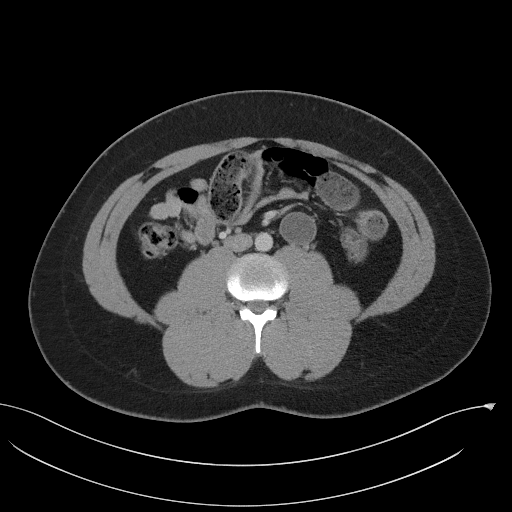
[im 66/103  soft-tissue]
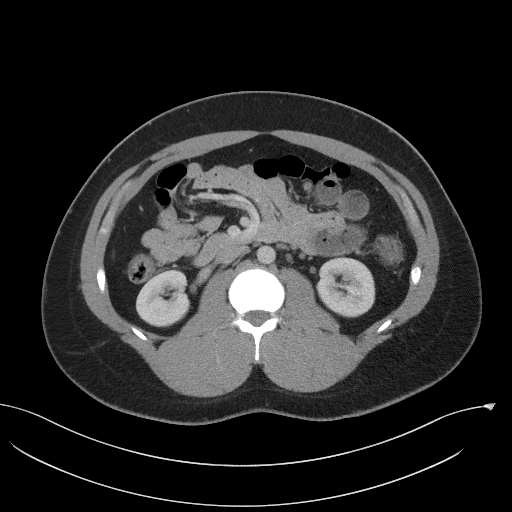
[im 66/103  bone]
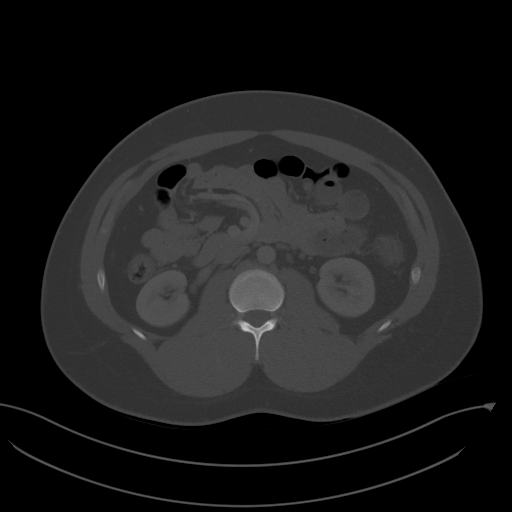
[im 74/103  soft-tissue]
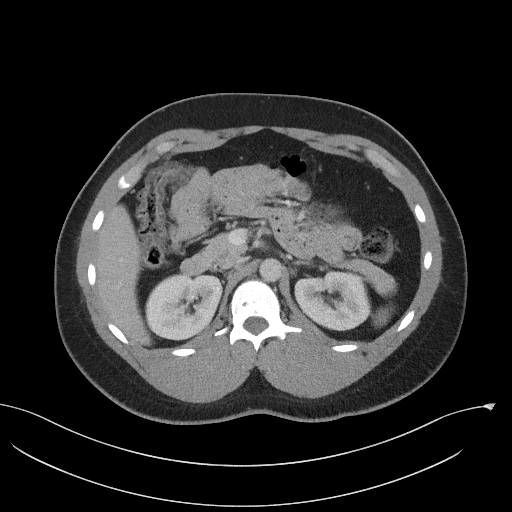
[im 82/103  soft-tissue]
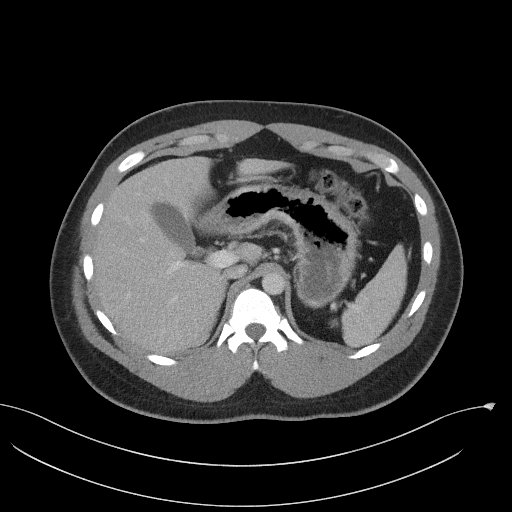
[im 90/103  soft-tissue]
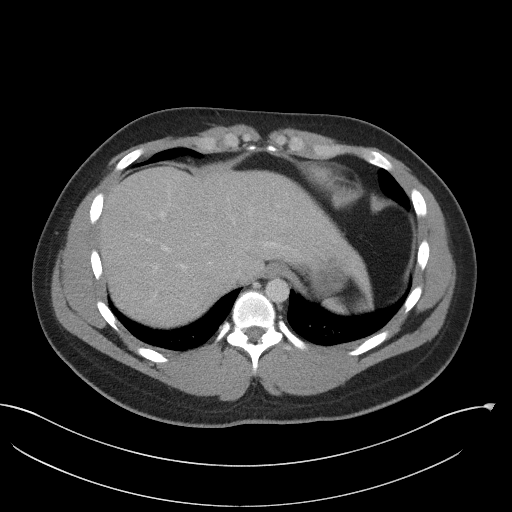
[im 98/103  soft-tissue]
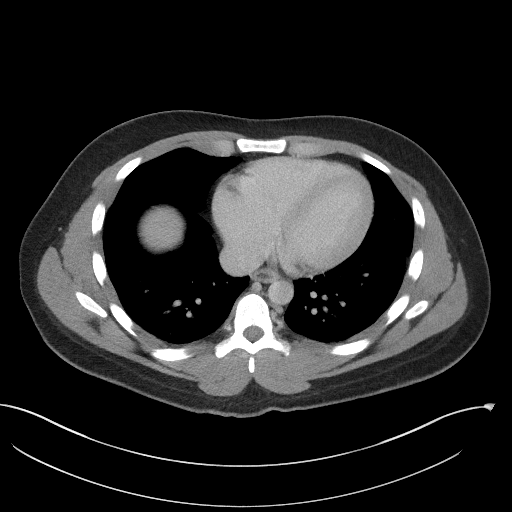

[Series 5: coronal st · coronal · 0.80mm/px · 3 of 102 slices shown]
[im 34/102  soft-tissue]
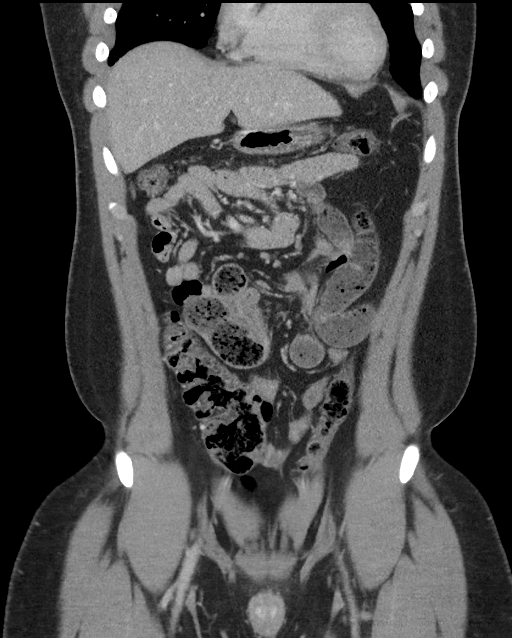
[im 45/102  soft-tissue]
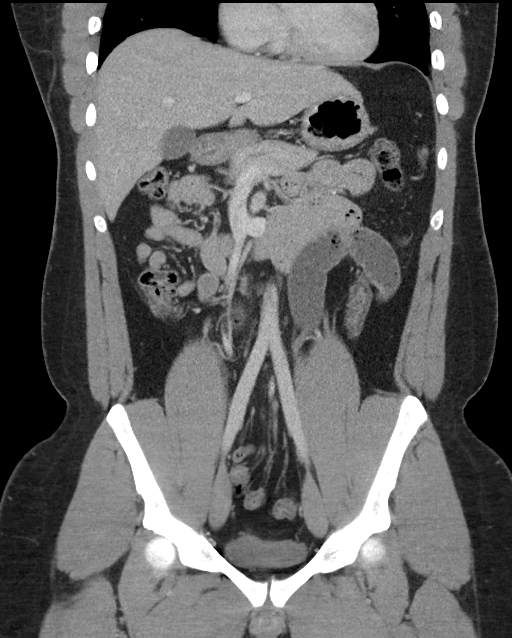
[im 57/102  soft-tissue]
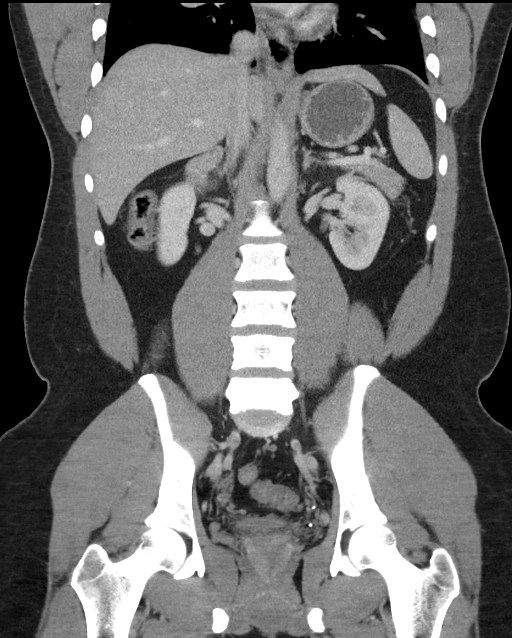

[16 of 46 positions shown; findings below may reference images not displayed]

FINDINGS: Lower chest: Clear lung bases.  Heart normal size.

Hepatobiliary: No focal liver abnormality is seen. No gallstones,
gallbladder wall thickening, or biliary dilatation.

Pancreas: Unremarkable. No pancreatic ductal dilatation or
surrounding inflammatory changes.

Spleen: Normal in size without focal abnormality.

Adrenals/Urinary Tract: Adrenal glands are unremarkable. Kidneys are
normal, without renal calculi, focal lesion, or hydronephrosis.
Bladder is unremarkable.

Stomach/Bowel: Stomach is within normal limits. Appendix appears
normal. No evidence of bowel wall thickening, distention, or
inflammatory changes.

Vascular/Lymphatic: No significant vascular findings are present. No
enlarged abdominal or pelvic lymph nodes.

Reproductive: Unremarkable.

Other: No abdominal wall hernia or abnormality. No abdominopelvic
ascites.

Musculoskeletal: Chronic bilateral pars defects at L5-S1 with a
grade 1 anterolisthesis. No other skeletal abnormality.
IMPRESSION: 1. No acute findings within the abdomen or pelvis. No findings to
account for the patient's symptoms.
2. Chronic bilateral pars defects at L5-S1 with a grade 1
anterolisthesis. No other abnormalities.
